# Patient Record
Sex: Female | Born: 1945
Health system: Southern US, Community
[De-identification: ages and names within clinical notes are randomized; demographics above are authoritative.]

## PROBLEM LIST (undated history)

## (undated) DIAGNOSIS — E785 Hyperlipidemia, unspecified: Secondary | ICD-10-CM

## (undated) DIAGNOSIS — I2510 Atherosclerotic heart disease of native coronary artery without angina pectoris: Secondary | ICD-10-CM

## (undated) DIAGNOSIS — K219 Gastro-esophageal reflux disease without esophagitis: Secondary | ICD-10-CM

## (undated) DIAGNOSIS — I341 Nonrheumatic mitral (valve) prolapse: Secondary | ICD-10-CM

## (undated) DIAGNOSIS — G459 Transient cerebral ischemic attack, unspecified: Secondary | ICD-10-CM

## (undated) DIAGNOSIS — I10 Essential (primary) hypertension: Secondary | ICD-10-CM

## (undated) HISTORY — DX: Essential (primary) hypertension: I10

## (undated) HISTORY — DX: Transient cerebral ischemic attack, unspecified: G45.9

## (undated) HISTORY — DX: Atherosclerotic heart disease of native coronary artery without angina pectoris: I25.10

## (undated) HISTORY — DX: Gastro-esophageal reflux disease without esophagitis: K21.9

## (undated) HISTORY — DX: Nonrheumatic mitral (valve) prolapse: I34.1

## (undated) HISTORY — PX: CARDIAC CATHETERIZATION: SHX172

## (undated) HISTORY — DX: Hyperlipidemia, unspecified: E78.5

---

## 1997-10-27 DIAGNOSIS — I2510 Atherosclerotic heart disease of native coronary artery without angina pectoris: Secondary | ICD-10-CM

## 1997-10-27 HISTORY — DX: Atherosclerotic heart disease of native coronary artery without angina pectoris: I25.10

## 2003-10-25 ENCOUNTER — Other Ambulatory Visit: Admission: RE | Admit: 2003-10-25 | Discharge: 2003-10-25 | Payer: Self-pay | Admitting: Obstetrics and Gynecology

## 2005-01-31 ENCOUNTER — Other Ambulatory Visit: Admission: RE | Admit: 2005-01-31 | Discharge: 2005-01-31 | Payer: Self-pay | Admitting: Obstetrics and Gynecology

## 2010-07-30 ENCOUNTER — Ambulatory Visit: Payer: Self-pay | Admitting: Cardiovascular Disease

## 2010-08-13 ENCOUNTER — Ambulatory Visit (HOSPITAL_COMMUNITY): Admission: RE | Admit: 2010-08-13 | Discharge: 2010-08-13 | Payer: Self-pay | Admitting: Cardiovascular Disease

## 2010-08-13 ENCOUNTER — Ambulatory Visit: Payer: Self-pay

## 2010-08-13 ENCOUNTER — Ambulatory Visit: Payer: Self-pay | Admitting: Internal Medicine

## 2010-08-13 ENCOUNTER — Encounter: Payer: Self-pay | Admitting: Cardiology

## 2010-08-13 ENCOUNTER — Ambulatory Visit: Payer: Self-pay | Admitting: Cardiology

## 2010-08-13 DIAGNOSIS — I2510 Atherosclerotic heart disease of native coronary artery without angina pectoris: Secondary | ICD-10-CM | POA: Insufficient documentation

## 2010-08-13 DIAGNOSIS — I059 Rheumatic mitral valve disease, unspecified: Secondary | ICD-10-CM | POA: Insufficient documentation

## 2010-08-16 ENCOUNTER — Ambulatory Visit: Payer: Self-pay | Admitting: Cardiovascular Disease

## 2010-11-19 ENCOUNTER — Ambulatory Visit: Admit: 2010-11-19 | Payer: Self-pay | Admitting: Cardiovascular Disease

## 2011-03-11 NOTE — Assessment & Plan Note (Signed)
[facility] CARDIOLOGY OFFICE NOTE   NAME:Hemmelgarn, LOIS SLAGEL                      MRN:          [id number]  DATE:08/16/2010                            DOB:          May 06, 1946    Kristen Maynard is a 65 year old female who is here today for a followup  visit.  She has the following problem list:  1. Coronary artery disease status post angioplasty without stent      placement in 1999 done by Dr. Juanda Chance.  2. Hypertension.  3. Hyperlipidemia.  4. Previous transient ischemic attack.  5. Gastroesophageal reflux disease.  6. Osteoporosis.   I evaluated the patient for symptoms of mild exertional chest pain.  I  referred her for a treadmill stress test as well as an echocardiogram  due to her symptoms of dyspnea and previous history of mitral valve  prolapse.  Overall, she actually feels better than before.  She  underwent treadmill stress test without any complaints of chest pain or  EKG changes.  Her blood pressure has not been well controlled.   MEDICATIONS:  1. Plavix 75 mg once daily.  2. Simvastatin 40 mg at bedtime.  3. Atenolol 50 mg once daily.  4. Multivitamin once daily.  5. Omeprazole 40 mg once daily.  6. Calcium once daily.  7. Losartan 50 mg once daily will be added.   PHYSICAL EXAMINATION:  VITAL SIGNS:  Weight is 169.6 pounds, blood  pressure is 158/78, pulse is 76, oxygen saturation is 97% on room air.  NECK:  No JVD or carotid bruits.  LUNGS:  Clear to auscultation.  HEART:  Regular rate and rhythm with no gallops or murmurs.  ABDOMEN:  Benign, nontender, nondistended.  EXTREMITIES:  No clubbing, cyanosis or edema.   IMPRESSION:  1. Mild exertional chest pain and dyspnea suggestive of class II      angina which does not seem to be limiting at this time.  She      underwent a treadmill stress test which showed no evidence of      ischemia.  There was no ST-depression or chest pain.  She had  limited exercise capacity though.  Her symptoms actually seems to      be better than before.  At this time I recommend blood pressure      control and aggressive treatment of her risk factors.  We will      continue with Plavix daily.  Her goal LDL should be less than 70      given that she has history of obstructive coronary artery disease.      Her stress test showed evidence of physical deconditioning and thus      I asked her to start an exercise program at least 30 minutes a day      5 times a week.  2. Dyspnea with prior history of mitral valve prolapse.  The      echocardiogram showed normal left ventricular systolic function,      mild left ventricular hypertrophy and mild grade 1 diastolic      dysfunction.  There was  no evidence of mitral valve prolapse.      There is no significant evidence of fluid overload.  3. Hypertension.  Blood pressure is not well controlled.  I will go      ahead and add losartan 50 mg once daily.  We will check basic      metabolic profile in 1 week to ensure stable renal function and      electrolytes.  4. Hyperlipidemia.  Continue with simvastatin.  Goal LDL as mentioned      above is less than 70.  The patient will follow up in 3 months from      now or earlier if needed.     Lorine Bears, MD  Electronically Signed    MA/MedQ  DD: 08/16/2010  DT: 08/17/2010  Job #: [id number]

## 2011-03-11 NOTE — Letter (Signed)
July 30, 2010     RE:  Kristen Maynard, Kristen Maynard  MRN:  [id number]  /  DOB:  1946-03-28   Sharlot Gowda, FNP  Dr. Robb Matar.   Regarding:  Kristen Maynard, medical number [id number].   Dear Kristen Maynard:   Thank you for referring Ms. Fries for further cardiac evaluation.  As  you are aware, this is a pleasant 65 year old female with the following  problem list:  1. Coronary artery disease status post angioplasty without stent      placement in 1999 done by Dr. Juanda Chance.  According to the patient a      stent was not placed due to a small size vessel.  No cardiac events      since then.  2. Mitral valve prolapse.  3. Hypertension.  4. Hyperlipidemia.  5. TIA.  6. Gastroesophageal reflux disease.  7. Osteoporosis.   Kristen Maynard is here today for evaluation of chest pain.  The patient had  angioplasty done in 1999 after she had chest pain and abnormal stress  test.  Her current chest pain is described as aching in the left side of  the chest radiating to the left shoulder.  It happens only with  overexertion and does not happen with regular activities.  It does not  seem to be also reproducible all the time.  She describes this  discomfort as much milder than her previous chest pain in 1999.  She  gets mild dyspnea with it.  There has been no palpitations, syncope or  presyncope.  Overall, she does not do much of regular exercise.  She has  not had any chest pain at rest.  Usually the episode lasts for only a  few minutes and resolves quickly.  The patient has been taking Plavix  since she had a TIA about a year ago.  There has been no recent change  in her medications.   PAST MEDICAL HISTORY:  As outlined above.   MEDICATIONS:  1. Plavix 75 mg once daily.  2. Simvastatin 40 mg at bedtime.  3. Vitamin D 1000 international units 2 a day.  4. Atenolol 50 mg once daily.  5. Multivitamin once daily.  6. Loratadine.  7. Omeprazole 40 mg once daily.  8. Calcium 560 mg once daily.   ALLERGIES:  SULFA AND CODEINE.   SOCIAL HISTORY:  Negative for smoking, alcohol or recreational drug use.  She is a retired [employer].   FAMILY HISTORY:  Negative for premature coronary artery disease.  Her  mother died at the age of 26 of lung cancer.  Her father died at the age  of 66 from COPD.   REVIEW OF SYSTEMS:  Remarkable for seasonal allergies, generalized  fatigue, heartburn, dysuria and recent bladder infection.  Chest pain as  described above as well as dyspnea.  Full review of system was performed  and is otherwise negative.   PHYSICAL EXAMINATION:  GENERAL:  The patient appears to be pleasant and  in no acute distress.  VITAL SIGNS:  Weight is 167.2 pounds, blood pressure is 168/81 in the  right arm, pulse is 63, oxygen saturation is 98% on room air.  HEENT:  Normocephalic, atraumatic.  NECK:  No JVD or carotid bruits.  RESPIRATORY:  Normal respiratory effort with no use of accessory  muscles.  Auscultation reveals normal breath sounds.  CARDIOVASCULAR:  Normal PMI.  Normal S1 and S2 with no gallops or  murmurs.  ABDOMEN:  Benign, nontender, nondistended.  EXTREMITIES:  With  no clubbing, cyanosis or edema.  SKIN:  Warm and dry with no rash.  PSYCHIATRIC:  She is alert, oriented x3 with normal mood and affect.  MUSCULOSKELETAL:  There is normal muscle strength in the upper and lower  extremities.   An electrocardiogram was performed which showed normal sinus rhythm with  no significant ST or T-wave changes.   IMPRESSION:  1. Mild exertional chest pain and dyspnea is suggestive of class II      angina.  She has known history of coronary artery disease and      underwent an angioplasty in 1999 without a stent placement.  At      this time I would like to obtain a treadmill stress test for      further evaluation.  If the stress test is only mildly abnormal, I      might choose to treat her medically with adjustment of her      medications with blood  pressure control.  I explained to her that      if the stress test is significantly abnormal, then we will likely      proceed with coronary angiography.  In the meanwhile will continue      Plavix once daily.  If needed in the future, we can add a small      dose aspirin.  2. Dyspnea with history of mitral valve prolapse:  We will obtain an      echocardiogram for further evaluation.  3. Hypertension:  Her blood pressure is elevated.  Will recheck on      this after the stress test and decide if adding another medication      is needed.  4. Hyperlipidemia:  Her lipid profile is not available for review.  We      will decide how aggressive we need to be in treating this patient      based on her cardiac workup.  The patient will follow-up after      these tests are done.  Thank you for allowing me to participate in      the care of your patient.   Sincerely yours,    Lorine Bears, MD    Sincerely,      Lorine Bears, MD  Electronically Signed    MA/MedQ  DD: 07/30/2010  DT: 07/30/2010  Job #: [id number]

## 2011-03-31 ENCOUNTER — Other Ambulatory Visit: Payer: Self-pay | Admitting: *Deleted

## 2011-03-31 MED ORDER — LOSARTAN POTASSIUM 50 MG PO TABS: 50.0000 mg | ORAL_TABLET | Freq: Every day | ORAL | Status: DC

## 2011-04-22 ENCOUNTER — Encounter: Payer: Self-pay | Admitting: Cardiovascular Disease

## 2011-07-03 ENCOUNTER — Other Ambulatory Visit: Payer: Self-pay | Admitting: *Deleted

## 2011-07-03 MED ORDER — LOSARTAN POTASSIUM 50 MG PO TABS: 50.0000 mg | ORAL_TABLET | Freq: Every day | ORAL | Status: DC

## 2011-07-17 ENCOUNTER — Encounter: Payer: Self-pay | Admitting: Cardiovascular Disease

## 2012-10-05 DIAGNOSIS — H2510 Age-related nuclear cataract, unspecified eye: Secondary | ICD-10-CM

## 2012-10-05 HISTORY — DX: Age-related nuclear cataract, unspecified eye: H25.10

## 2012-10-12 DIAGNOSIS — B0052 Herpesviral keratitis: Secondary | ICD-10-CM | POA: Insufficient documentation

## 2012-10-12 HISTORY — DX: Herpesviral keratitis: B00.52

## 2014-05-03 DIAGNOSIS — Z961 Presence of intraocular lens: Secondary | ICD-10-CM

## 2014-05-03 HISTORY — DX: Presence of intraocular lens: Z96.1

## 2015-07-02 ENCOUNTER — Encounter (HOSPITAL_COMMUNITY): Payer: Self-pay | Admitting: Emergency Medicine

## 2015-07-02 ENCOUNTER — Inpatient Hospital Stay (HOSPITAL_COMMUNITY): Payer: Medicare Other

## 2015-07-02 ENCOUNTER — Observation Stay (HOSPITAL_COMMUNITY)
Admission: EM | Admit: 2015-07-02 | Discharge: 2015-07-04 | Disposition: A | Discharge status: Home / Self Care | Payer: Medicare Other | Attending: Internal Medicine | Admitting: Internal Medicine

## 2015-07-02 DIAGNOSIS — I10 Essential (primary) hypertension: Secondary | ICD-10-CM | POA: Insufficient documentation

## 2015-07-02 DIAGNOSIS — I2510 Atherosclerotic heart disease of native coronary artery without angina pectoris: Secondary | ICD-10-CM | POA: Insufficient documentation

## 2015-07-02 DIAGNOSIS — E785 Hyperlipidemia, unspecified: Secondary | ICD-10-CM | POA: Diagnosis not present

## 2015-07-02 DIAGNOSIS — R0789 Other chest pain: Secondary | ICD-10-CM | POA: Diagnosis not present

## 2015-07-02 DIAGNOSIS — Z79899 Other long term (current) drug therapy: Secondary | ICD-10-CM | POA: Diagnosis not present

## 2015-07-02 DIAGNOSIS — Z7982 Long term (current) use of aspirin: Secondary | ICD-10-CM | POA: Diagnosis not present

## 2015-07-02 DIAGNOSIS — Z8673 Personal history of transient ischemic attack (TIA), and cerebral infarction without residual deficits: Secondary | ICD-10-CM | POA: Diagnosis not present

## 2015-07-02 DIAGNOSIS — K219 Gastro-esophageal reflux disease without esophagitis: Secondary | ICD-10-CM | POA: Diagnosis not present

## 2015-07-02 DIAGNOSIS — R531 Weakness: Secondary | ICD-10-CM | POA: Diagnosis present

## 2015-07-02 DIAGNOSIS — R778 Other specified abnormalities of plasma proteins: Secondary | ICD-10-CM | POA: Diagnosis present

## 2015-07-02 DIAGNOSIS — R079 Chest pain, unspecified: Secondary | ICD-10-CM

## 2015-07-02 DIAGNOSIS — R002 Palpitations: Secondary | ICD-10-CM | POA: Diagnosis not present

## 2015-07-02 DIAGNOSIS — M810 Age-related osteoporosis without current pathological fracture: Secondary | ICD-10-CM | POA: Insufficient documentation

## 2015-07-02 DIAGNOSIS — R7989 Other specified abnormal findings of blood chemistry: Secondary | ICD-10-CM

## 2015-07-02 HISTORY — DX: Other specified abnormal findings of blood chemistry: R79.89

## 2015-07-02 LAB — URINALYSIS, ROUTINE W REFLEX MICROSCOPIC
BILIRUBIN URINE: NEGATIVE
GLUCOSE, UA: NEGATIVE mg/dL
Ketones, ur: NEGATIVE mg/dL
Nitrite: POSITIVE — AB
Protein, ur: NEGATIVE mg/dL
Specific Gravity, Urine: 1.026 (ref 1.005–1.030)
Urobilinogen, UA: 0.2 mg/dL (ref 0.0–1.0)
pH: 5 (ref 5.0–8.0)

## 2015-07-02 LAB — BASIC METABOLIC PANEL
ANION GAP: 9 (ref 5–15)
BUN: 21 mg/dL — AB (ref 6–20)
CALCIUM: 8.9 mg/dL (ref 8.9–10.3)
CO2: 22 mmol/L (ref 22–32)
Chloride: 109 mmol/L (ref 101–111)
Creatinine, Ser: 0.97 mg/dL (ref 0.44–1.00)
GFR calc Af Amer: >60 mL/min (ref >60)
GFR calc non Af Amer: 57 mL/min — ABNORMAL LOW (ref >60)
GLUCOSE: 121 mg/dL — AB (ref 65–99)
Potassium: 3.8 mmol/L (ref 3.5–5.1)
Sodium: 140 mmol/L (ref 135–145)

## 2015-07-02 LAB — CBC
HEMATOCRIT: 38.4 % (ref 36.0–46.0)
HEMOGLOBIN: 13.2 g/dL (ref 12.0–15.0)
MCH: 29.4 pg (ref 26.0–34.0)
MCHC: 34.4 g/dL (ref 30.0–36.0)
MCV: 85.5 fL (ref 78.0–100.0)
Platelets: 174 10*3/uL (ref 150–400)
RBC: 4.49 MIL/uL (ref 3.87–5.11)
RDW: 13 % (ref 11.5–15.5)
WBC: 6.8 10*3/uL (ref 4.0–10.5)

## 2015-07-02 LAB — URINE MICROSCOPIC-ADD ON

## 2015-07-02 LAB — I-STAT TROPONIN, ED
Troponin i, poc: 0.03 ng/mL (ref 0.00–0.08)
Troponin i, poc: 0.12 ng/mL (ref 0.00–0.08)

## 2015-07-02 LAB — CBG MONITORING, ED: Glucose-Capillary: 108 mg/dL — ABNORMAL HIGH (ref 65–99)

## 2015-07-02 MED ORDER — ASPIRIN 81 MG PO CHEW
324.0000 mg | CHEWABLE_TABLET | Freq: Once | ORAL | Status: AC
  Administered 2015-07-02: 324 mg via ORAL
  Filled 2015-07-02: qty 4

## 2015-07-02 MED ORDER — INFLUENZA VAC SPLIT QUAD 0.5 ML IM SUSY
0.5000 mL | PREFILLED_SYRINGE | INTRAMUSCULAR | Status: AC
  Administered 2015-07-03: 0.5 mL via INTRAMUSCULAR
  Filled 2015-07-02: qty 0.5

## 2015-07-02 NOTE — ED Provider Notes (Signed)
Patient seen and evaluated. Discussed with PA Westfall. Patient describes palpitations. Denies pain or shortness of breath. Felt irregular rapid pulse. Felt at within her chest, and also with feeling her pulse. States he would go fast and then slow and then take off again had one blood pressure reading she thought was 1/130, noted that was under 80. Symptoms resolved suddenly while en route hospital.  History of angioplasty without stent in 1999. Had a "normal" go cardiogram within the last 10 years. No history of known structural heart disease. No previous episodes of tachycardia palpitations or diagnosis of atrial fibrillation or arrhythmias.  EKG shows sinus rhythm without injury. First troponin normal. Second troponin bumps 0.12. Plan will be admission,obs, tele, , serial enzymes.  Rolland Porter, MD 07/02/15 2229

## 2015-07-02 NOTE — ED Notes (Signed)
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 Pt states she went inside to take her BP and got "3 abnormal readings, one was 130/100 and another was 80/20". Pt denies any pain at this time, no weakness noted to arms or legs.

## 2015-07-02 NOTE — ED Notes (Signed)
MD at bedside. 

## 2015-07-02 NOTE — ED Provider Notes (Signed)
CSN: [id number]     Arrival date & time 07/02/15  1708 History   First MD Initiated Contact with Patient 07/02/15 1712     Chief Complaint  Patient presents with  . Near Syncope  . Weakness    HPI   Kristen Maynard is a 69 y.o. female with a PMH of CAD s/p angioplasty without stent (1999), HTN, HLD, TIA who presents to the ED with weakness and palpitations. 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 She states she took her BP and had readings of 130/100 and 80/20. She reports this lasted for around 2 hours and slowly started to resolve. She is asymptomatic now. She denies headache, dizziness, syncope, chest pain, shortness of breath, abdominal pain, N/V/D/C, fever, chills, dysuria. Reports urinary urgency. She states this has never happened to her before.   Past Medical History  Diagnosis Date  . CAD (coronary artery disease)     status post angioplasty w/out stent placement in 1999 done by Dr. Juanda Chance. According to pt, a stent was not placed due to small size vessel. No cardiac events since then.  . Mitral valve prolapse   . HTN (hypertension)   . HLD (hyperlipidemia)   . TIA (transient ischemic attack)   . GERD (gastroesophageal reflux disease)   . Osteoporosis    Past Surgical History  Procedure Laterality Date  . Cardiac catheterization     Family History  Problem Relation Age of Onset  . Lung cancer Mother   . COPD Father    Social History  Substance Use Topics  . Smoking status: Never Smoker   . Smokeless tobacco: None  . Alcohol Use: No   OB History    No data available      Review of Systems  Constitutional: Negative for fever, chills, activity change, appetite change and fatigue.  HENT: Negative for congestion.   Eyes: Negative for visual disturbance.  Respiratory: Negative for cough and  shortness of breath.   Cardiovascular: Positive for palpitations. Negative for chest pain and leg swelling.  Gastrointestinal: Negative for nausea, vomiting, abdominal pain, diarrhea, constipation and abdominal distention.  Genitourinary: Positive for urgency. Negative for dysuria and frequency.  Musculoskeletal: Negative for myalgias, back pain, arthralgias, neck pain and neck stiffness.  Skin: Negative for color change, pallor, rash and wound.  Neurological: Positive for weakness. Negative for dizziness, syncope, facial asymmetry, speech difficulty, light-headedness, numbness and headaches.  All other systems reviewed and are negative.     Allergies  Aggrenox; Codeine; and Sulfa antibiotics  Home Medications   Prior to Admission medications   Medication Sig Start Date End Date Taking? Authorizing Provider  acetaminophen (TYLENOL) 325 MG tablet Take 650 mg by mouth every 6 (six) hours as needed for moderate pain.   Yes Historical Provider, MD  aspirin 325 MG tablet Take 162.5 mg by mouth once.   Yes Historical Provider, MD  atenolol (TENORMIN) 50 MG tablet Take 50 mg by mouth daily.     Yes Historical Provider, MD  Calcium-Magnesium-Vitamin D (CALCIUM 1200+D3 PO) Take 1 capsule by mouth daily.   Yes Historical Provider, MD  Cholecalciferol (VITAMIN D) 1000 UNITS capsule Take 1,000 Units by mouth daily.    Yes Historical Provider, MD  clopidogrel (PLAVIX) 75 MG tablet Take 75 mg by mouth daily.     Yes  Historical Provider, MD  CRANBERRY PO Take 1 tablet by mouth daily.   Yes Historical Provider, MD  loratadine (CLARITIN) 10 MG tablet Take 10 mg by mouth daily.   Yes Historical Provider, MD  losartan (COZAAR) 100 MG tablet Take 100 mg by mouth daily.   Yes Historical Provider, MD  Multiple Vitamin (MULTIVITAMIN WITH MINERALS) TABS tablet Take 1 tablet by mouth daily.   Yes Historical Provider, MD  omeprazole (PRILOSEC) 40 MG capsule Take 40 mg by mouth daily.   Yes Historical Provider, MD   simvastatin (ZOCOR) 40 MG tablet Take 40 mg by mouth daily at 6 PM.   Yes Historical Provider, MD  acyclovir (ZOVIRAX) 400 MG tablet Take 400 mg by mouth 2 (two) times daily between meals as needed (for optic infection).    Historical Provider, MD    BP 121/56 mmHg  Pulse 87  Temp(Src) 97.7 F (36.5 C) (Oral)  Resp 22  SpO2 97% Physical Exam  Constitutional: She is oriented to person, place, and time. She appears well-developed and well-nourished. No distress.  HENT:  Head: Normocephalic and atraumatic.  Right Ear: External ear normal.  Left Ear: External ear normal.  Nose: Nose normal.  Mouth/Throat: Uvula is midline, oropharynx is clear and moist and mucous membranes are normal. No oropharyngeal exudate.  Eyes: Conjunctivae, EOM and lids are normal. Pupils are equal, round, and reactive to light. Right eye exhibits no discharge. Left eye exhibits no discharge. No scleral icterus.  Neck: Normal range of motion. Neck supple.  Cardiovascular: Normal rate, regular rhythm, normal heart sounds, intact distal pulses and normal pulses.   Pulmonary/Chest: Effort normal and breath sounds normal. No respiratory distress. She has no wheezes. She has no rales. She exhibits no tenderness.  Abdominal: Soft. Normal appearance and bowel sounds are normal. She exhibits no distension and no mass. There is no tenderness. There is no rigidity, no rebound and no guarding.  Musculoskeletal: Normal range of motion. She exhibits no edema or tenderness.  Neurological: She is alert and oriented to person, place, and time. She has normal strength. No cranial nerve deficit or sensory deficit. Coordination normal.  Skin: Skin is warm, dry and intact. No rash noted. She is not diaphoretic. No erythema. No pallor.  Psychiatric: She has a normal mood and affect. Her speech is normal and behavior is normal. Judgment and thought content normal.  Nursing note and vitals reviewed.   ED Course  Procedures (including  critical care time)  Labs Review Labs Reviewed  BASIC METABOLIC PANEL - Abnormal; Notable for the following:    Glucose, Bld 121 (*)    BUN 21 (*)    GFR calc non Af Amer 57 (*)    All other components within normal limits  URINALYSIS, ROUTINE W REFLEX MICROSCOPIC (NOT AT [facility]) - Abnormal; Notable for the following:    APPearance HAZY (*)    Hgb urine dipstick TRACE (*)    Nitrite POSITIVE (*)    Leukocytes, UA SMALL (*)    All other components within normal limits  URINE MICROSCOPIC-ADD ON - Abnormal; Notable for the following:    Squamous Epithelial / LPF FEW (*)    Bacteria, UA FEW (*)    All other components within normal limits  CBG MONITORING, ED - Abnormal; Notable for the following:    Glucose-Capillary 108 (*)    All other components within normal limits  CBC  I-STAT TROPOININ, ED    Imaging Review Dg Chest 2 View  07/02/2015  CLINICAL DATA:  Weakness and dizziness for 1 day.  EXAM: CHEST  2 VIEW  COMPARISON:  01/05/2015  FINDINGS: The heart size and mediastinal contours are within normal limits. Both lungs are clear. The visualized skeletal structures are unremarkable.  IMPRESSION: No active cardiopulmonary disease.   Electronically Signed   By: Burman Nieves M.D.   On: 07/02/2015 23:29     I have personally reviewed and evaluated these images and lab results as part of my medical decision-making.   EKG Interpretation   Date/Time:  Monday July 02 2015 17:16:40 EDT Ventricular Rate:  101 PR Interval:  181 QRS Duration: 83 QT Interval:  364 QTC Calculation: 472 R Axis:   71 Text Interpretation:  Sinus tachycardia Abnormal R-wave progression, early  transition Confirmed by Fayrene Fearing  MD, MARK ([id number]) on 07/02/2015 9:06:08 PM      MDM   Final diagnoses:  Weakness  Palpitations  Elevated troponin   69 year old female presents with weakness and palpitations, which started this afternoon and lasted approximately 2 hours, resolved prior to arrival. She  denies headache, dizziness, syncope, chest pain, shortness of breath, abdominal pain, N/V/D/C, fever, chills, dysuria, urgency, frequency.   Patient is afebrile. Vitals stable. Normal neuro exam. No focal neurological deficit. RRR, lungs clear to auscultation bilaterally, abdomen soft, non-distended, non-tender. No lower extremity edema.  EKG non-ischemic. Troponin negative x 1. CBC, BMP unremarkable. UA positive for nitrites and leukocytes. CXR no active cardiopulmonary disease.  Will obtain 3 hour troponin.  Delta troponin elevated at 0.12. Patient to be admitted for further evaluation and management, including serial enzymes. Spoke with hospitalist, who will admit.   BP 121/56 mmHg  Pulse 87  Temp(Src) 97.7 F (36.5 C) (Oral)  Resp 22  SpO2 97%       Mady Gemma, PA-C 07/03/15 0038  Rolland Porter, MD 07/10/15 [phone]

## 2015-07-03 ENCOUNTER — Observation Stay (HOSPITAL_BASED_OUTPATIENT_CLINIC_OR_DEPARTMENT_OTHER): Payer: Medicare Other

## 2015-07-03 ENCOUNTER — Observation Stay (HOSPITAL_COMMUNITY): Payer: Medicare Other

## 2015-07-03 ENCOUNTER — Encounter (HOSPITAL_COMMUNITY): Payer: Self-pay | Admitting: Internal Medicine

## 2015-07-03 DIAGNOSIS — E785 Hyperlipidemia, unspecified: Secondary | ICD-10-CM | POA: Diagnosis present

## 2015-07-03 DIAGNOSIS — I10 Essential (primary) hypertension: Secondary | ICD-10-CM | POA: Diagnosis present

## 2015-07-03 DIAGNOSIS — I2510 Atherosclerotic heart disease of native coronary artery without angina pectoris: Secondary | ICD-10-CM

## 2015-07-03 DIAGNOSIS — R000 Tachycardia, unspecified: Secondary | ICD-10-CM

## 2015-07-03 DIAGNOSIS — Z8673 Personal history of transient ischemic attack (TIA), and cerebral infarction without residual deficits: Secondary | ICD-10-CM

## 2015-07-03 DIAGNOSIS — R7989 Other specified abnormal findings of blood chemistry: Secondary | ICD-10-CM | POA: Diagnosis not present

## 2015-07-03 DIAGNOSIS — R002 Palpitations: Secondary | ICD-10-CM | POA: Diagnosis present

## 2015-07-03 HISTORY — DX: Personal history of transient ischemic attack (TIA), and cerebral infarction without residual deficits: Z86.73

## 2015-07-03 HISTORY — DX: Palpitations: R00.2

## 2015-07-03 HISTORY — DX: Essential (primary) hypertension: I10

## 2015-07-03 HISTORY — DX: Hyperlipidemia, unspecified: E78.5

## 2015-07-03 LAB — COMPREHENSIVE METABOLIC PANEL
ALT: 21 U/L (ref 14–54)
AST: 24 U/L (ref 15–41)
Albumin: 3.4 g/dL — ABNORMAL LOW (ref 3.5–5.0)
Alkaline Phosphatase: 85 U/L (ref 38–126)
Anion gap: 9 (ref 5–15)
BUN: 18 mg/dL (ref 6–20)
CHLORIDE: 107 mmol/L (ref 101–111)
CO2: 23 mmol/L (ref 22–32)
Calcium: 8.4 mg/dL — ABNORMAL LOW (ref 8.9–10.3)
Creatinine, Ser: 0.71 mg/dL (ref 0.44–1.00)
Glucose, Bld: 101 mg/dL — ABNORMAL HIGH (ref 65–99)
POTASSIUM: 3.8 mmol/L (ref 3.5–5.1)
SODIUM: 139 mmol/L (ref 135–145)
Total Bilirubin: 0.6 mg/dL (ref 0.3–1.2)
Total Protein: 5.8 g/dL — ABNORMAL LOW (ref 6.5–8.1)

## 2015-07-03 LAB — CBC WITH DIFFERENTIAL/PLATELET
Basophils Absolute: 0 10*3/uL (ref 0.0–0.1)
Basophils Relative: 0 % (ref 0–1)
Eosinophils Absolute: 0.1 10*3/uL (ref 0.0–0.7)
Eosinophils Relative: 1 % (ref 0–5)
HCT: 36.6 % (ref 36.0–46.0)
HEMOGLOBIN: 12.5 g/dL (ref 12.0–15.0)
LYMPHS ABS: 0.7 10*3/uL (ref 0.7–4.0)
LYMPHS PCT: 17 % (ref 12–46)
MCH: 29.3 pg (ref 26.0–34.0)
MCHC: 34.2 g/dL (ref 30.0–36.0)
MCV: 85.9 fL (ref 78.0–100.0)
Monocytes Absolute: 0.3 10*3/uL (ref 0.1–1.0)
Monocytes Relative: 7 % (ref 3–12)
NEUTROS PCT: 75 % (ref 43–77)
Neutro Abs: 3.4 10*3/uL (ref 1.7–7.7)
Platelets: 174 10*3/uL (ref 150–400)
RBC: 4.26 MIL/uL (ref 3.87–5.11)
RDW: 13.4 % (ref 11.5–15.5)
WBC: 4.5 10*3/uL (ref 4.0–10.5)

## 2015-07-03 LAB — TROPONIN I
TROPONIN I: 0.09 ng/mL — AB (ref <0.031)
Troponin I: 0.14 ng/mL — ABNORMAL HIGH (ref <0.031)
Troponin I: 0.06 ng/mL — ABNORMAL HIGH (ref <0.031)

## 2015-07-03 LAB — T4, FREE: FREE T4: 0.91 ng/dL (ref 0.61–1.12)

## 2015-07-03 LAB — D-DIMER, QUANTITATIVE (NOT AT ARMC): D DIMER QUANT: 0.6 ug{FEU}/mL — AB (ref 0.00–0.48)

## 2015-07-03 LAB — TSH: TSH: 1.158 u[IU]/mL (ref 0.350–4.500)

## 2015-07-03 MED ORDER — ONDANSETRON HCL 4 MG PO TABS
4.0000 mg | ORAL_TABLET | Freq: Four times a day (QID) | ORAL | Status: DC | PRN
Start: 2015-07-03 — End: 2015-07-04

## 2015-07-03 MED ORDER — IOHEXOL 350 MG/ML SOLN
100.0000 mL | Freq: Once | INTRAVENOUS | Status: AC | PRN
  Administered 2015-07-03: 100 mL via INTRAVENOUS

## 2015-07-03 MED ORDER — CLOPIDOGREL BISULFATE 75 MG PO TABS
75.0000 mg | ORAL_TABLET | Freq: Every day | ORAL | Status: DC
  Administered 2015-07-03: 75 mg via ORAL
  Filled 2015-07-03: qty 1

## 2015-07-03 MED ORDER — SIMVASTATIN 40 MG PO TABS
40.0000 mg | ORAL_TABLET | Freq: Every day | ORAL | Status: DC
  Administered 2015-07-03 (×2): 40 mg via ORAL
  Filled 2015-07-03 (×2): qty 1

## 2015-07-03 MED ORDER — ASPIRIN 325 MG PO TABS
162.5000 mg | ORAL_TABLET | Freq: Every day | ORAL | Status: DC
  Administered 2015-07-03: 162.5 mg via ORAL
  Filled 2015-07-03: qty 1

## 2015-07-03 MED ORDER — ASPIRIN 81 MG PO CHEW
81.0000 mg | CHEWABLE_TABLET | Freq: Every day | ORAL | Status: DC
  Administered 2015-07-04: 81 mg via ORAL
  Filled 2015-07-03: qty 1

## 2015-07-03 MED ORDER — ENOXAPARIN SODIUM 40 MG/0.4ML ~~LOC~~ SOLN
40.0000 mg | SUBCUTANEOUS | Status: DC
  Administered 2015-07-03 – 2015-07-04 (×2): 40 mg via SUBCUTANEOUS
  Filled 2015-07-03 (×2): qty 0.4

## 2015-07-03 MED ORDER — SODIUM CHLORIDE 0.9 % IJ SOLN
3.0000 mL | Freq: Two times a day (BID) | INTRAMUSCULAR | Status: DC
  Administered 2015-07-03 – 2015-07-04 (×4): 3 mL via INTRAVENOUS

## 2015-07-03 MED ORDER — ATENOLOL 25 MG PO TABS
50.0000 mg | ORAL_TABLET | Freq: Every day | ORAL | Status: DC
  Administered 2015-07-03 (×2): 50 mg via ORAL
  Filled 2015-07-03 (×2): qty 2

## 2015-07-03 MED ORDER — PANTOPRAZOLE SODIUM 40 MG PO TBEC
40.0000 mg | DELAYED_RELEASE_TABLET | Freq: Every day | ORAL | Status: DC
  Administered 2015-07-03 – 2015-07-04 (×2): 40 mg via ORAL
  Filled 2015-07-03 (×2): qty 1

## 2015-07-03 MED ORDER — LORATADINE 10 MG PO TABS
10.0000 mg | ORAL_TABLET | Freq: Every day | ORAL | Status: DC
  Administered 2015-07-03 – 2015-07-04 (×2): 10 mg via ORAL
  Filled 2015-07-03 (×2): qty 1

## 2015-07-03 MED ORDER — ONDANSETRON HCL 4 MG/2ML IJ SOLN: 4.0000 mg | Freq: Four times a day (QID) | INTRAMUSCULAR | Status: DC | PRN

## 2015-07-03 MED ORDER — ACETAMINOPHEN 650 MG RE SUPP: 650.0000 mg | Freq: Four times a day (QID) | RECTAL | Status: DC | PRN

## 2015-07-03 MED ORDER — ADULT MULTIVITAMIN W/MINERALS CH
1.0000 | ORAL_TABLET | Freq: Every day | ORAL | Status: DC
  Administered 2015-07-03 – 2015-07-04 (×2): 1 via ORAL
  Filled 2015-07-03 (×2): qty 1

## 2015-07-03 MED ORDER — LOSARTAN POTASSIUM 50 MG PO TABS
100.0000 mg | ORAL_TABLET | Freq: Every day | ORAL | Status: DC
  Administered 2015-07-03 – 2015-07-04 (×2): 100 mg via ORAL
  Filled 2015-07-03 (×2): qty 2

## 2015-07-03 MED ORDER — ACETAMINOPHEN 325 MG PO TABS: 650.0000 mg | ORAL_TABLET | Freq: Four times a day (QID) | ORAL | Status: DC | PRN

## 2015-07-03 NOTE — H&P (Signed)
[facility] History and Physical  Kristen Maynard RUE:[id number] DOB: July 11, 1946 DOA: 07/02/2015  Referring physician: Dr. Fayrene Fearing. PCP: Konrad Felix, MD  Specialists: None. He is to follow with Dr. Juanda Chance previously.  Chief Complaint: Palpitations.  HPI: Kristen Maynard is a 69 y.o. female with history of CAD status post stenting, hypertension and hyperlipidemia presents to the ER because of palpitations. Patient states last evening patient had palpitations which was continuous lasting almost for 2 hours during which patient felt mildly dizzy and diaphoretic. Denies any chest pain or shortness of breath. In the ER EKG was showing sinus tachycardia rate around 110 bpm. Presently patient is asymptomatic. Denies any previous episodes of similar. Patient has been admitted for further observation. Denies missing her medications. Denies any nausea vomiting abdominal pain or diarrhea. Denies any fever chills or productive cough.  Review of Systems: As presented in the history of presenting illness, rest negative.  Past Medical History  Diagnosis Date  . CAD (coronary artery disease)     status post angioplasty w/out stent placement in 1999 done by Dr. Juanda Chance. According to pt, a stent was not placed due to small size vessel. No cardiac events since then.  . Mitral valve prolapse   . HTN (hypertension)   . HLD (hyperlipidemia)   . TIA (transient ischemic attack)   . GERD (gastroesophageal reflux disease)   . Osteoporosis    Past Surgical History  Procedure Laterality Date  . Cardiac catheterization     Social History:  reports that she has never smoked. She does not have any smokeless tobacco history on file. She reports that she does not drink alcohol or use illicit drugs. Where does patient live home. Can patient participate in ADLs? Yes.  Allergies  Allergen Reactions  . Aggrenox [Aspirin-Dipyridamole Er] Other (See Comments)    Headaches maybe? Told never to take med  . Codeine  Other (See Comments)    Confusion  . Sulfa Antibiotics Other (See Comments)    Seizures     Family History:  Family History  Problem Relation Age of Onset  . Lung cancer Mother   . COPD Father       Prior to Admission medications   Medication Sig Start Date End Date Taking? Authorizing Provider  acetaminophen (TYLENOL) 325 MG tablet Take 650 mg by mouth every 6 (six) hours as needed for moderate pain.   Yes Historical Provider, MD  aspirin 325 MG tablet Take 162.5 mg by mouth once.   Yes Historical Provider, MD  atenolol (TENORMIN) 50 MG tablet Take 50 mg by mouth daily.     Yes Historical Provider, MD  Calcium-Magnesium-Vitamin D (CALCIUM 1200+D3 PO) Take 1 capsule by mouth daily.   Yes Historical Provider, MD  Cholecalciferol (VITAMIN D) 1000 UNITS capsule Take 1,000 Units by mouth daily.    Yes Historical Provider, MD  clopidogrel (PLAVIX) 75 MG tablet Take 75 mg by mouth daily.     Yes Historical Provider, MD  CRANBERRY PO Take 1 tablet by mouth daily.   Yes Historical Provider, MD  loratadine (CLARITIN) 10 MG tablet Take 10 mg by mouth daily.   Yes Historical Provider, MD  losartan (COZAAR) 100 MG tablet Take 100 mg by mouth daily.   Yes Historical Provider, MD  Multiple Vitamin (MULTIVITAMIN WITH MINERALS) TABS tablet Take 1 tablet by mouth daily.   Yes Historical Provider, MD  omeprazole (PRILOSEC) 40 MG capsule Take 40 mg by mouth daily.   Yes Historical Provider, MD  simvastatin (  ZOCOR) 40 MG tablet Take 40 mg by mouth daily at 6 PM.   Yes Historical Provider, MD  acyclovir (ZOVIRAX) 400 MG tablet Take 400 mg by mouth 2 (two) times daily between meals as needed (for optic infection).    Historical Provider, MD    Physical Exam: Filed Vitals:   07/02/15 2100 07/02/15 2130 07/02/15 2200 07/02/15 2255  BP: 146/70 139/61 162/62 162/64  Pulse: 92 87 93 91  Temp:    97.5 F (36.4 C)  TempSrc:    Oral  Resp: Height:     (1.676 m)  Weight:    68.992 kg (152 lb  1.6 oz)  SpO2: 97% 98% 96% 100%     General:  Moderately built and nourished.  Eyes: Anicteric no pallor.  ENT: No discharge from the ears eyes nose or mouth.  Neck: No mass felt.  Cardiovascular: S1 and S2 heard.  Respiratory: No rhonchi or crepitations.  Abdomen: Soft nontender bowel sounds present.  Skin: No rash.  Musculoskeletal: No edema.  Psychiatric: Appears normal.  Neurologic: Alert awake oriented to time place and person. Moves all extremities.  Labs on Admission:  Basic Metabolic Panel:  Recent Labs Lab 07/02/15 1730  NA 140  K 3.8  CL 109  CO2 22  GLUCOSE 121*  BUN 21*  CREATININE 0.97  CALCIUM 8.9   Liver Function Tests: No results for input(s): AST, ALT, ALKPHOS, BILITOT, PROT, ALBUMIN in the last 168 hours. No results for input(s): LIPASE, AMYLASE in the last 168 hours. No results for input(s): AMMONIA in the last 168 hours. CBC:  Recent Labs Lab 07/02/15 1730  WBC 6.8  HGB 13.2  HCT 38.4  MCV 85.5  PLT 174   Cardiac Enzymes: No results for input(s): CKTOTAL, CKMB, CKMBINDEX, TROPONINI in the last 168 hours.  BNP (last 3 results) No results for input(s): BNP in the last 8760 hours.  ProBNP (last 3 results) No results for input(s): PROBNP in the last 8760 hours.  CBG:  Recent Labs Lab 07/02/15 1729  GLUCAP 108*    Radiological Exams on Admission: Dg Chest 2 View  07/02/2015   CLINICAL DATA:  Weakness and dizziness for 1 day.  EXAM: CHEST  2 VIEW  COMPARISON:  01/05/2015  FINDINGS: The heart size and mediastinal contours are within normal limits. Both lungs are clear. The visualized skeletal structures are unremarkable.  IMPRESSION: No active cardiopulmonary disease.   Electronically Signed   By: Burman Nieves M.D.   On: 07/02/2015 23:29    EKG: Independently reviewed. Sinus tachycardia.  Assessment/Plan Principal Problem:   Palpitations Active Problems:   Elevated troponin   CAD in native artery s/p stent    Essential hypertension   Hyperlipidemia   History of stroke   1. Palpitations - at this time patient's monitor showing sinus rhythm. Will closely observe in telemetry. Check thyroid function tests. Check d-dimer and posteriorly with CT angiogram. I have consulted cardiology. 2. CAD status post stenting with mildly elevated troponin - troponin may be mildly limited due to patient's tachycardia. Patient's on anti-plated agent beta blockers and statins which will be continued. 3. Hyperlipidemia on statins.  I have reviewed patient's old charts were labs. I have consulted cardiology.   DVT Prophylaxis Lovenox.  Code Status: Full code.  Family Communication: Discussed with patient's daughter.  Disposition Plan: Admit for observation.    Taahir Grisby N. [facility] Pager [phone].  If 7PM-7AM, please contact night-coverage [email or web address] Password [email or web address]  07/03/2015, 12:28 AM

## 2015-07-03 NOTE — Progress Notes (Signed)
During bedside report, patient notified RNs that she has had more than 5 loose BMs since admission. Handoff to [place], Charity fundraiser, who has already spoken with Dr. Benjamine Mola. Plan pending.

## 2015-07-03 NOTE — Progress Notes (Signed)
  Echocardiogram 2D Echocardiogram has been performed.  Delcie Roch 07/03/2015, 3:26 PM

## 2015-07-03 NOTE — Progress Notes (Signed)
Patient admitted after midnight, please see H&P.   1. Palpitations - tele shows NSR. Will closely observe in telemetry.  thyroid function tests ok. CTA negative.  Cardiology consult. 2. CAD status post stenting with mildly elevated troponin - troponin may be mildly limited due to patient's tachycardia. Patient's on anti-plated agent beta blockers and statins which will be continued. 3. Hyperlipidemia on statins. 4. Diarrhea- present on admission- will check pathogen panel  Marlin Canary

## 2015-07-03 NOTE — Progress Notes (Addendum)
Patient admitted with chest pain and palpitations. Lab results so far have demonstrated elevated troponin I at point-of-care and positive d-dimer fibrin-derivatives. Patient has been asymptomatic (no complaint of chest pain, palpitations or SOB) since arrival to floor and vital signs have been WDL.  CT-angiogram of chest was ordered approximately one hour ago to evaluate for pulmonary embolus and was negative for any acute findings. However, troponin I continues to rise (current lab value = 0.14, up from 0.12 at point-of-care).  Patient remains complaint/symptom-free.  Have communicated new lab values, pertinent diagnostic data and patient status to Tama Gander, NP on call for [facility].  Continuing to monitor closely.     Addendum for 07/03/15 05:35:  Spoke with admitting physician, Dr. Toniann Fail re: same above. No new orders at this time. Will continue to monitor.

## 2015-07-03 NOTE — Consult Note (Addendum)
Admit date: 07/02/2015 Referring Physician  Dr. Benjamine Mola Primary Physician Konrad Felix, MD Primary Cardiologist  Dr. Kirke Corin Reason for Consultation  elevated troponin  HPI: 69 year old female with known coronary artery disease status post angioplasty without stent placement in 1999, Dr. Dickie La, essential hypertension, hyperlipidemia, GERD who last saw Dr. Kirke Corin in 2011 here with elevated troponin in the setting of diarrhea, no chest pain.  Previously had a treadmill stress test which showed no evidence of ischemia, no ST segment depression at that time. Exercise was limited. Symptoms were well controlled with aunt anginal medications. Plavix was continued.  Most recent echo cardiac and showed no evidence of mitral valve prolapse as she was previously diagnosed several years ago.  Here, troponin was mildly elevated at 0.14, now 0.09. This was felt to be possibly secondary to palpitations was lasted approximately 2 hours, continuous leaving the patient feeling weak, lightheaded. No chest pain, no shortness of breath accompanied. EKG in emergency department was sinus tachycardia with heart rate of 110 bpm.   After she was admitted, she began having diarrhea. No bleeding, no melana. No anemia.  A CT of the chest was performed, no pulmonary embolism. She did have scattered coronary calcifications noted.  Original EKG on 07/02/15 show sinus tachycardia rate 105 with no other ST segment changes.   PMH:   Past Medical History  Diagnosis Date  . CAD (coronary artery disease)     status post angioplasty w/out stent placement in 1999 done by Dr. Juanda Chance. According to pt, a stent was not placed due to small size vessel. No cardiac events since then.  . Mitral valve prolapse   . HTN (hypertension)   . HLD (hyperlipidemia)   . TIA (transient ischemic attack)   . GERD (gastroesophageal reflux disease)   . Osteoporosis     PSH:   Past Surgical History  Procedure Laterality Date  . Cardiac  catheterization     Allergies:  Aggrenox; Codeine; and Sulfa antibiotics Prior to Admit Meds:   Prior to Admission medications   Medication Sig Start Date End Date Taking? Authorizing Provider  acetaminophen (TYLENOL) 325 MG tablet Take 650 mg by mouth every 6 (six) hours as needed for moderate pain.   Yes Historical Provider, MD  aspirin 325 MG tablet Take 162.5 mg by mouth once.   Yes Historical Provider, MD  atenolol (TENORMIN) 50 MG tablet Take 50 mg by mouth daily.     Yes Historical Provider, MD  Calcium-Magnesium-Vitamin D (CALCIUM 1200+D3 PO) Take 1 capsule by mouth daily.   Yes Historical Provider, MD  Cholecalciferol (VITAMIN D) 1000 UNITS capsule Take 1,000 Units by mouth daily.    Yes Historical Provider, MD  clopidogrel (PLAVIX) 75 MG tablet Take 75 mg by mouth daily.     Yes Historical Provider, MD  CRANBERRY PO Take 1 tablet by mouth daily.   Yes Historical Provider, MD  loratadine (CLARITIN) 10 MG tablet Take 10 mg by mouth daily.   Yes Historical Provider, MD  losartan (COZAAR) 100 MG tablet Take 100 mg by mouth daily.   Yes Historical Provider, MD  Multiple Vitamin (MULTIVITAMIN WITH MINERALS) TABS tablet Take 1 tablet by mouth daily.   Yes Historical Provider, MD  omeprazole (PRILOSEC) 40 MG capsule Take 40 mg by mouth daily.   Yes Historical Provider, MD  simvastatin (ZOCOR) 40 MG tablet Take 40 mg by mouth daily at 6 PM.   Yes Historical Provider, MD  acyclovir (ZOVIRAX) 400 MG tablet Take 400 mg by  mouth 2 (two) times daily between meals as needed (for optic infection).    Historical Provider, MD   Fam HX:    Family History  Problem Relation Age of Onset  . Lung cancer Mother   . COPD Father    Social HX:    Social History   Social History  . Marital Status: Married    Spouse Name: N/A  . Number of Children: N/A  . Years of Education: N/A   Occupational History  . Not on file.   Social History Main Topics  . Smoking status: Never Smoker   . Smokeless  tobacco: Not on file  . Alcohol Use: No  . Drug Use: No  . Sexual Activity: Not on file   Other Topics Concern  . Not on file   Social History Narrative   Retired.      ROS:  All 11 ROS were addressed and are negative except what is stated in the HPI   Physical Exam: Blood pressure 139/52, pulse 83, temperature 98.6 F (37 C), temperature source Oral, resp. rate 20, height 5\' 6"  (1.676 m), weight 150 lb 14.4 oz (68.448 kg), SpO2 96 %.   General: Well developed, well nourished, in no acute distress Head: Eyes PERRLA, No xanthomas.   Normal cephalic and atramatic  Lungs:   Clear bilaterally to auscultation and percussion. Normal respiratory effort. No wheezes, no rales. Heart:   HRRR S1 S2 Pulses are 2+ & equal. No murmur, rubs, gallops.  No carotid bruit. No JVD.  No abdominal bruits.  Abdomen: Bowel sounds are positive, abdomen soft and non-tender without masses. No hepatosplenomegaly. Msk:  Back normal. Normal strength and tone for age. Extremities:  No clubbing, cyanosis or edema.  DP +1, skin with pale appearance Neuro: Alert and oriented X 3, non-focal, MAE x 4 GU: Deferred Rectal: Deferred Psych:  Good affect, responds appropriately      Labs: Lab Results  Component Value Date   WBC 4.5 07/03/2015   HGB 12.5 07/03/2015   HCT 36.6 07/03/2015   MCV 85.9 07/03/2015   PLT 174 07/03/2015     Recent Labs Lab 07/03/15 0648  NA 139  K 3.8  CL 107  CO2 23  BUN 18  CREATININE 0.71  CALCIUM 8.4*  PROT 5.8*  BILITOT 0.6  ALKPHOS 85  ALT 21  AST 24  GLUCOSE 101*    Recent Labs  07/03/15 0059 07/03/15 0648  TROPONINI 0.14* 0.09*   No results found for: CHOL, HDL, LDLCALC, TRIG Lab Results  Component Value Date   DDIMER 0.60* 07/03/2015     Radiology:  Dg Chest 2 View  07/02/2015   CLINICAL DATA:  Weakness and dizziness for 1 day.  EXAM: CHEST  2 VIEW  COMPARISON:  01/05/2015  FINDINGS: The heart size and mediastinal contours are within normal limits.  Both lungs are clear. The visualized skeletal structures are unremarkable.  IMPRESSION: No active cardiopulmonary disease.   Electronically Signed   By: Burman Nieves M.D.   On: 07/02/2015 23:29   Ct Angio Chest Pe W/cm &/or Wo Cm  07/03/2015   CLINICAL DATA:  Chest pain, relieve since arrival. Elevated D-dimer.  EXAM: CT ANGIOGRAPHY CHEST WITH CONTRAST  TECHNIQUE: Multidetector CT imaging of the chest was performed using the standard protocol during bolus administration of intravenous contrast. Multiplanar CT image reconstructions and MIPs were obtained to evaluate the vascular anatomy.  CONTRAST:  OMNIPAQUE IOHEXOL 350 MG/ML SOLN  COMPARISON:  None.  FINDINGS: Technically adequate study with good opacification of the central and segmental pulmonary arteries. No focal filling defects demonstrated. No evidence of significant pulmonary embolus.  Normal heart size. Normal caliber thoracic aorta. Scattered aortic calcification. Great vessel origins are patent. Coronary artery calcifications. Esophagus is decompressed. Lymph nodes in the mediastinum are not pathologically enlarged.  Evaluation of lungs is limited due to respiratory motion artifact. There is dependent atelectasis in the lungs. No focal consolidation. No pleural effusions. No pneumothorax. Airways appear patent.  Included portions of the upper abdominal organs are grossly unremarkable. Degenerative changes in the spine. No destructive bone lesions.  Review of the MIP images confirms the above findings.  IMPRESSION: No evidence of significant pulmonary embolus. Dependent atelectasis in the lung bases.   Electronically Signed   By: Burman Nieves M.D.   On: 07/03/2015 03:29   Personally viewed.  EKG:  As above in history of present illness. Personally viewed.   Echo 2011: Reviewed as above, normal ejection fraction   ASSESSMENT/PLAN:    69 year old female with coronary artery disease post angioplasty in 1999, prior treadmill test in  approximately 2011 with no ischemia, hyperlipidemia, hypertension who had approximately 2 hours of increased heart rate, palpitations, tachycardia now with mildly elevated troponin, peak 0.14 now 0.09 with no chest pain. Diarrhea was noted, present on admission.  1. Elevated troponin  - Minimally elevated troponin, trended downward, likely associated with underlying medical illness, diarrhea, tachycardia palpitations. Current telemetry review shows no adverse arrhythmias. Heart rate is currently in the 70s. She is feeling better but still having diarrhea.     -Echocardiogram pending to ensure proper structure and function.   - As an outpatient, it would not be unreasonable to check a stress test given her minimally elevated troponin, likely result of demand ischemia. Since she is not having any chest discomfort nor EKG changes, I do not think that this is a traditional non-ST elevation myocardial infarction. If symptoms change, diagnosis may change.  2. Coronary artery disease-prior balloon angioplasty in 1999, Dr. Dickie La. We will decrease aspirin to 81 mg and continue clopidogrel 75 mg.  3. Diarrhea - per primary team. Possible underlying gastroenterology driving current illness.  4. Sinus tachycardia-currently resolved. Continue with beta blocker, atenolol 50 g once a day. Checking echocardiogram. PE CT normal.   5. Essential hypertension - currently well controlled.  6. Hyperlipidemia-continue with statin.  7. History of stroke-no residual symptoms. Continuing Plavix, low-dose aspirin.   Donato Schultz, MD  07/03/2015  10:45 AM

## 2015-07-04 ENCOUNTER — Encounter (HOSPITAL_COMMUNITY): Payer: Self-pay | Admitting: Student

## 2015-07-04 ENCOUNTER — Other Ambulatory Visit: Payer: Self-pay | Admitting: Student

## 2015-07-04 DIAGNOSIS — I10 Essential (primary) hypertension: Secondary | ICD-10-CM | POA: Diagnosis not present

## 2015-07-04 DIAGNOSIS — E785 Hyperlipidemia, unspecified: Secondary | ICD-10-CM

## 2015-07-04 DIAGNOSIS — R7989 Other specified abnormal findings of blood chemistry: Secondary | ICD-10-CM

## 2015-07-04 DIAGNOSIS — R002 Palpitations: Secondary | ICD-10-CM | POA: Diagnosis not present

## 2015-07-04 DIAGNOSIS — R778 Other specified abnormalities of plasma proteins: Secondary | ICD-10-CM

## 2015-07-04 DIAGNOSIS — Z8673 Personal history of transient ischemic attack (TIA), and cerebral infarction without residual deficits: Secondary | ICD-10-CM | POA: Diagnosis not present

## 2015-07-04 LAB — MAGNESIUM: MAGNESIUM: 2.1 mg/dL (ref 1.7–2.4)

## 2015-07-04 LAB — CBC
HEMATOCRIT: 35.9 % — AB (ref 36.0–46.0)
HEMOGLOBIN: 12.3 g/dL (ref 12.0–15.0)
MCH: 29.5 pg (ref 26.0–34.0)
MCHC: 34.3 g/dL (ref 30.0–36.0)
MCV: 86.1 fL (ref 78.0–100.0)
Platelets: 149 10*3/uL — ABNORMAL LOW (ref 150–400)
RBC: 4.17 MIL/uL (ref 3.87–5.11)
RDW: 13.2 % (ref 11.5–15.5)
WBC: 4 10*3/uL (ref 4.0–10.5)

## 2015-07-04 LAB — BASIC METABOLIC PANEL
Anion gap: 5 (ref 5–15)
BUN: 13 mg/dL (ref 6–20)
CHLORIDE: 109 mmol/L (ref 101–111)
CO2: 25 mmol/L (ref 22–32)
CREATININE: 0.78 mg/dL (ref 0.44–1.00)
Calcium: 8.4 mg/dL — ABNORMAL LOW (ref 8.9–10.3)
GFR calc non Af Amer: >60 mL/min (ref >60)
Glucose, Bld: 109 mg/dL — ABNORMAL HIGH (ref 65–99)
POTASSIUM: 4.1 mmol/L (ref 3.5–5.1)
Sodium: 139 mmol/L (ref 135–145)

## 2015-07-04 LAB — T3, FREE: T3 FREE: 2.7 pg/mL (ref 2.0–4.4)

## 2015-07-04 MED ORDER — ASPIRIN 81 MG PO CHEW: 81.0000 mg | CHEWABLE_TABLET | Freq: Every day | ORAL | Status: AC

## 2015-07-04 NOTE — Discharge Planning (Addendum)
Dr. Rennis Golden requested the patient have an outpatient stress test and a 2 week-event monitor.  She has been scheduled for an Outpatient Nuclear Stress Test on September 21st, 2016 at 11:45AM with [name], PA-C. She was instructed to consume no liquids or food after midnight prior to her procedure.  She has an appointment to receive her event monitor at 2:00PM on September 28th, 2016.  The patient was instructed to contact our office at [phone] with any questions or concerns about her procedure.  Signed, Wandra Mannan, PA-C 07/04/2015, 3:18 PM

## 2015-07-04 NOTE — Discharge Summary (Signed)
Physician Discharge Summary  Kristen Maynard QIO:[id number] DOB: 1946/01/04 DOA: 07/02/2015  PCP: Konrad Felix, MD  Admit date: 07/02/2015 Discharge date: 07/04/2015  Time spent: 35 minutes  Recommendations for Outpatient Follow-up:  1. Outpatient ST and 2 week event monitor 2. GI pathogen panel pending at d/c but diarrhea has resolved  Discharge Diagnoses:  Principal Problem:   Palpitations Active Problems:   Elevated troponin   CAD in native artery s/p stent   Essential hypertension   Hyperlipidemia   History of stroke   Discharge Condition: improved  Diet recommendation: cardiac  Filed Weights   07/02/15 2255 07/03/15 0500 07/04/15 0559  Weight: 68.992 kg (152 lb 1.6 oz) 68.448 kg (150 lb 14.4 oz) 68.1 kg (150 lb 2.1 oz)    History of present illness:  Kristen Maynard is a 69 y.o. female with history of CAD status post stenting, hypertension and hyperlipidemia presents to the ER because of palpitations. Patient states last evening patient had palpitations which was continuous lasting almost for 2 hours during which patient felt mildly dizzy and diaphoretic. Denies any chest pain or shortness of breath. In the ER EKG was showing sinus tachycardia rate around 110 bpm. Presently patient is asymptomatic. Denies any previous episodes of similar. Patient has been admitted for further observation. Denies missing her medications. Denies any nausea vomiting abdominal pain or diarrhea. Denies any fever chills or productive cough.  Hospital Course:  1. Palpitations - tele shows NSR. thyroid function tests ok. CTA negative. Cardiology consult-- outpatient ST and monitor 2. CAD status post stenting with mildly elevated troponin - troponin may be mildly limited due to patient's tachycardia. Patient's on anti-plated agent beta blockers and statins which will be continued. 3. Hyperlipidemia on statins. 4. Diarrhea- resolved, pathogen panel  pending  Procedures:    Consultations:  cardiology  Discharge Exam: Filed Vitals:   07/04/15 0559  BP: 131/64  Pulse: 77  Temp: 98.1 F (36.7 C)  Resp: 18    General: A+Ox3, NAD   Discharge Instructions   Discharge Instructions    Diet - low sodium heart healthy    Complete by:  As directed      Discharge instructions    Complete by:  As directed   Outpatient stress test and event monitor Hydrate when working outside in the heat Limit caffeine     Increase activity slowly    Complete by:  As directed           Current Discharge Medication List    START taking these medications   Details  aspirin 81 MG chewable tablet Chew 1 tablet (81 mg total) by mouth daily. Qty: 30 tablet, Refills: 0      CONTINUE these medications which have NOT CHANGED   Details  acetaminophen (TYLENOL) 325 MG tablet Take 650 mg by mouth every 6 (six) hours as needed for moderate pain.    atenolol (TENORMIN) 50 MG tablet Take 50 mg by mouth daily.      Calcium-Magnesium-Vitamin D (CALCIUM 1200+D3 PO) Take 1 capsule by mouth daily.    Cholecalciferol (VITAMIN D) 1000 UNITS capsule Take 1,000 Units by mouth daily.     clopidogrel (PLAVIX) 75 MG tablet Take 75 mg by mouth daily.      CRANBERRY PO Take 1 tablet by mouth daily.    loratadine (CLARITIN) 10 MG tablet Take 10 mg by mouth daily.    losartan (COZAAR) 100 MG tablet Take 100 mg by mouth daily.    Multiple Vitamin (MULTIVITAMIN  WITH MINERALS) TABS tablet Take 1 tablet by mouth daily.    omeprazole (PRILOSEC) 40 MG capsule Take 40 mg by mouth daily.    simvastatin (ZOCOR) 40 MG tablet Take 40 mg by mouth daily at 6 PM.    acyclovir (ZOVIRAX) 400 MG tablet Take 400 mg by mouth 2 (two) times daily between meals as needed (for optic infection).      STOP taking these medications     aspirin 325 MG tablet        Allergies  Allergen Reactions  . Aggrenox [Aspirin-Dipyridamole Er] Other (See Comments)    Headaches  maybe? Told never to take med  . Codeine Other (See Comments)    Confusion  . Sulfa Antibiotics Other (See Comments)    Seizures    Follow-up Information    Follow up with [facility], MD In 1 week.   Specialty:  Family Medicine   Contact information:   [address], [place] [address] [phone]       Follow up with Donato Schultz, MD In 2 weeks.   Specialty:  Cardiology   Contact information:   [address] [place] [address] [phone]        The results of significant diagnostics from this hospitalization (including imaging, microbiology, ancillary and laboratory) are listed below for reference.    Significant Diagnostic Studies: Dg Chest 2 View  07/02/2015   CLINICAL DATA:  Weakness and dizziness for 1 day.  EXAM: CHEST  2 VIEW  COMPARISON:  01/05/2015  FINDINGS: The heart size and mediastinal contours are within normal limits. Both lungs are clear. The visualized skeletal structures are unremarkable.  IMPRESSION: No active cardiopulmonary disease.   Electronically Signed   By: Burman Nieves M.D.   On: 07/02/2015 23:29   Ct Angio Chest Pe W/cm &/or Wo Cm  07/03/2015   CLINICAL DATA:  Chest pain, relieve since arrival. Elevated D-dimer.  EXAM: CT ANGIOGRAPHY CHEST WITH CONTRAST  TECHNIQUE: Multidetector CT imaging of the chest was performed using the standard protocol during bolus administration of intravenous contrast. Multiplanar CT image reconstructions and MIPs were obtained to evaluate the vascular anatomy.  CONTRAST:  OMNIPAQUE IOHEXOL 350 MG/ML SOLN  COMPARISON:  None.  FINDINGS: Technically adequate study with good opacification of the central and segmental pulmonary arteries. No focal filling defects demonstrated. No evidence of significant pulmonary embolus.  Normal heart size. Normal caliber thoracic aorta. Scattered aortic calcification. Great vessel origins are patent. Coronary artery calcifications. Esophagus is  decompressed. Lymph nodes in the mediastinum are not pathologically enlarged.  Evaluation of lungs is limited due to respiratory motion artifact. There is dependent atelectasis in the lungs. No focal consolidation. No pleural effusions. No pneumothorax. Airways appear patent.  Included portions of the upper abdominal organs are grossly unremarkable. Degenerative changes in the spine. No destructive bone lesions.  Review of the MIP images confirms the above findings.  IMPRESSION: No evidence of significant pulmonary embolus. Dependent atelectasis in the lung bases.   Electronically Signed   By: Burman Nieves M.D.   On: 07/03/2015 03:29    Microbiology: No results found for this or any previous visit (from the past 240 hour(s)).   Labs: Basic Metabolic Panel:  Recent Labs Lab 07/02/15 1730 07/03/15 0648 07/04/15 0053  NA 140 139 139  K 3.8 3.8 4.1  CL 109 107 109  CO2 22 23 25   GLUCOSE 121* 101* 109*  BUN 21* 18 13  CREATININE  0.97 0.71 0.78  CALCIUM 8.9 8.4* 8.4*  MG  --   --  2.1   Liver Function Tests:  Recent Labs Lab 07/03/15 0648  AST 24  ALT 21  ALKPHOS 85  BILITOT 0.6  PROT 5.8*  ALBUMIN 3.4*   No results for input(s): LIPASE, AMYLASE in the last 168 hours. No results for input(s): AMMONIA in the last 168 hours. CBC:  Recent Labs Lab 07/02/15 1730 07/03/15 0648 07/04/15 0053  WBC 6.8 4.5 4.0  NEUTROABS  --  3.4  --   HGB 13.2 12.5 12.3  HCT 38.4 36.6 35.9*  MCV 85.5 85.9 86.1  PLT 174 174 149*   Cardiac Enzymes:  Recent Labs Lab 07/03/15 0059 07/03/15 0648 07/03/15 1204  TROPONINI 0.14* 0.09* 0.06*   BNP: BNP (last 3 results) No results for input(s): BNP in the last 8760 hours.  ProBNP (last 3 results) No results for input(s): PROBNP in the last 8760 hours.  CBG:  Recent Labs Lab 07/02/15 1729  GLUCAP 108*       Signed:  Jonnie Kubly  [facility] 07/04/2015, 12:55 PM

## 2015-07-04 NOTE — Progress Notes (Signed)
Patient Name: Kristen Maynard Date of Encounter: 07/04/2015  Principal Problem:   Palpitations Active Problems:   Elevated troponin   CAD in native artery s/p stent   Essential hypertension   Hyperlipidemia   History of stroke    Primary Cardiologist: Dr. Kirke Corin Patient Profile: 69 yo female w/ PMH of CAD(s/p angioplasty w/o stent in 1999; treadmill test in 2011 with no ischemia), HTN, HLD, TIA, and GERD admitted for near syncope, tachycardia, and palpitations. Troponin elevated to 0.14 at admission.  SUBJECTIVE: Feeling well. Denies any chest pain, shortness of breath, or palpitations. Says her diarrhea has resolved and had a solid bowel movement yesterday.  OBJECTIVE Filed Vitals:   07/03/15 0500 07/03/15 1615 07/03/15 2019 07/04/15 0559  BP: 139/52 123/55 140/64 131/64  Pulse: 83  85 77  Temp: 98.6 F (37 C) 98.6 F (37 C) 98.6 F (37 C) 98.1 F (36.7 C)  TempSrc: Oral Oral Oral Oral  Resp:  18 18 18   Height:      Weight: 150 lb 14.4 oz (68.448 kg)   150 lb 2.1 oz (68.1 kg)  SpO2: 96% 95% 98% 97%    Intake/Output Summary (Last 24 hours) at 07/04/15 0909 Last data filed at 07/04/15 0818  Gross per 24 hour  Intake    240 ml  Output    700 ml  Net   -460 ml   Filed Weights   07/02/15 2255 07/03/15 0500 07/04/15 0559  Weight: 152 lb 1.6 oz (68.992 kg) 150 lb 14.4 oz (68.448 kg) 150 lb 2.1 oz (68.1 kg)    PHYSICAL EXAM General: Well developed, well nourished, female in no acute distress. Head: Normocephalic, atraumatic.  Neck: Supple without bruits, JVD not elevated. Lungs:  Resp regular and unlabored, CTA without wheezing or rales. Heart: RRR, S1, S2, no S3, S4, or murmur; no rub. Abdomen: Soft, non-tender, non-distended with normoactive bowel sounds. No hepatomegaly. No rebound/guarding. No obvious abdominal masses. Extremities: No clubbing, cyanosis, or edema. Distal pedal pulses are 2+ bilaterally. Neuro: Alert and oriented X 3. Moves all extremities  spontaneously. Psych: Normal affect.   LABS: CBC: Recent Labs  07/03/15 0648 07/04/15 0053  WBC 4.5 4.0  NEUTROABS 3.4  --   HGB 12.5 12.3  HCT 36.6 35.9*  MCV 85.9 86.1  PLT 174 149*   Basic Metabolic Panel: Recent Labs  07/03/15 0648 07/04/15 0053  NA 139 139  K 3.8 4.1  CL 107 109  CO2 23 25  GLUCOSE 101* 109*  BUN 18 13  CREATININE 0.71 0.78  CALCIUM 8.4* 8.4*  MG  --  2.1   Liver Function Tests: Recent Labs  07/03/15 0648  AST 24  ALT 21  ALKPHOS 85  BILITOT 0.6  PROT 5.8*  ALBUMIN 3.4*   Cardiac Enzymes: Recent Labs  07/03/15 0059 07/03/15 0648 07/03/15 1204  TROPONINI 0.14* 0.09* 0.06*    Recent Labs  07/02/15 1802 07/02/15 2053  TROPIPOC 0.03 0.12*   D-dimer: Recent Labs  07/03/15 0059  DDIMER 0.60*   Thyroid Function Tests: Recent Labs  07/03/15 0059  TSH 1.158  T3FREE 2.7    TELE:  Sinus rhythm with HR in 70's - 80's. No episodes of tachycardia noted. No atopic events.   ECHO: 07/03/2015 Study Conclusions - Left ventricle: The cavity size was normal. Wall thickness was normal. Systolic function was vigorous. The estimated ejection fraction was in the range of 65% to 70%. Wall motion was normal; there were no regional  wall motion abnormalities.  Impressions: - Compared to the prior study, there has been no significant interval change.  Radiology/Studies: Dg Chest 2 View: 07/02/2015   CLINICAL DATA:  Weakness and dizziness for 1 day.  EXAM: CHEST  2 VIEW  COMPARISON:  01/05/2015  FINDINGS: The heart size and mediastinal contours are within normal limits. Both lungs are clear. The visualized skeletal structures are unremarkable.  IMPRESSION: No active cardiopulmonary disease.   Electronically Signed   By: Burman Nieves M.D.   On: 07/02/2015 23:29   Ct Angio Chest Pe W/cm &/or Wo Cm: 07/03/2015   CLINICAL DATA:  Chest pain, relieve since arrival. Elevated D-dimer.  EXAM: CT ANGIOGRAPHY CHEST WITH CONTRAST  TECHNIQUE:  Multidetector CT imaging of the chest was performed using the standard protocol during bolus administration of intravenous contrast. Multiplanar CT image reconstructions and MIPs were obtained to evaluate the vascular anatomy.  CONTRAST:  OMNIPAQUE IOHEXOL 350 MG/ML SOLN  COMPARISON:  None.  FINDINGS: Technically adequate study with good opacification of the central and segmental pulmonary arteries. No focal filling defects demonstrated. No evidence of significant pulmonary embolus.  Normal heart size. Normal caliber thoracic aorta. Scattered aortic calcification. Great vessel origins are patent. Coronary artery calcifications. Esophagus is decompressed. Lymph nodes in the mediastinum are not pathologically enlarged.  Evaluation of lungs is limited due to respiratory motion artifact. There is dependent atelectasis in the lungs. No focal consolidation. No pleural effusions. No pneumothorax. Airways appear patent.  Included portions of the upper abdominal organs are grossly unremarkable. Degenerative changes in the spine. No destructive bone lesions.  Review of the MIP images confirms the above findings.  IMPRESSION: No evidence of significant pulmonary embolus. Dependent atelectasis in the lung bases.   Electronically Signed   By: Burman Nieves M.D.   On: 07/03/2015 03:29     Current Medications:  . aspirin  81 mg Oral Daily  . atenolol  50 mg Oral QHS  . clopidogrel  75 mg Oral Daily  . enoxaparin (LOVENOX) injection  40 mg Subcutaneous Q24H  . loratadine  10 mg Oral Daily  . losartan  100 mg Oral Daily  . multivitamin with minerals  1 tablet Oral Daily  . pantoprazole  40 mg Oral Daily  . simvastatin  40 mg Oral q1800  . sodium chloride  3 mL Intravenous Q12H      ASSESSMENT AND PLAN: 1. Elevated troponin - history of CAD with prior balloon angioplasty in 1999. Last treadmill test in 2011 which showed no signs of ischemia. - Troponin initially at 0.14 and has continued to decline to  0.09 and 0.06 respectively. Thought to be associated with underlying medical illness (diarrhea, tachycardia, and palpitations) and to represent demand ischemia.  - Echo on 07/03/2015 showed EF of 65-70% with no wall motion abnormalities. - Could consider having stress test as an outpatient. - continue ASA, BB, ARB, and Plavix.   2. Diarrhea  - Per Primary Team - According to the patient, this has resolved and she had a formed stool yesterday. She reports the diarrhea started the day after experiencing tachycardia and palpitations.   3. Sinus tachycardia - Currently resolved, with HR in 70's - 80's over the past 24 hours. Patient reports when she was having the palpitations, her pulse felt very irregular and felt as if it were skipping beats. Consider Event Recorder as an outpatient. - Echocardiogram without any abnormalities. - Continue with beta blocker.  4. Essential hypertension  - BP has been  123/55 - 140/64 in the past 24 hours.  5. Hyperlipidemia - continue statin.  Otherwise, per Primary Team.  Signed, Wandra Mannan , PA-C 9:09 AM 07/04/2015 Pager: [phone]

## 2015-07-05 LAB — GI PATHOGEN PANEL BY PCR, STOOL
Campylobacter by PCR: NOT DETECTED
Cryptosporidium by PCR: NOT DETECTED
E COLI (ETEC) LT/ST: NOT DETECTED
E COLI (STEC): NOT DETECTED
E COLI 0157 BY PCR: NOT DETECTED
G lamblia by PCR: NOT DETECTED
Rotavirus A by PCR: NOT DETECTED
SALMONELLA BY PCR: NOT DETECTED
Shigella by PCR: NOT DETECTED

## 2015-07-16 ENCOUNTER — Telehealth (HOSPITAL_COMMUNITY): Payer: Self-pay | Admitting: Radiology

## 2015-07-16 ENCOUNTER — Telehealth (HOSPITAL_COMMUNITY): Payer: Self-pay | Admitting: *Deleted

## 2015-07-16 NOTE — Telephone Encounter (Signed)
Patient given detailed instructions per Myocardial Perfusion Study Information Sheet for test on 07/18/15 at 11:45. Patient notified to arrive 15 minutes early and that it is imperative to arrive on time for appointment to keep from having the test rescheduled.  If you need to cancel or reschedule your appointment, please call the office within 24 hours of your appointment. Failure to do so may result in a cancellation of your appointment, and a $50 no show fee. Patient verbalized understanding. Kristen Maynard

## 2015-07-16 NOTE — Telephone Encounter (Signed)
Patient given detailed instructions per Myocardial Perfusion Study Information Sheet for test on 07/18/15 at 1145. Patient notified to arrive 15 minutes early and that it is imperative to arrive on time for appointment to keep from having the test rescheduled.  If you need to cancel or reschedule your appointment, please call the office within 24 hours of your appointment. Failure to do so may result in a cancellation of your appointment, and a $50 no show fee. Patient verbalized understanding. Melvyn Novas

## 2015-07-17 ENCOUNTER — Encounter: Payer: Self-pay | Admitting: Cardiology

## 2015-07-17 ENCOUNTER — Encounter: Payer: Self-pay | Admitting: Internal Medicine

## 2015-07-18 ENCOUNTER — Ambulatory Visit (HOSPITAL_COMMUNITY): Payer: Medicare Other | Attending: Student

## 2015-07-18 DIAGNOSIS — I10 Essential (primary) hypertension: Secondary | ICD-10-CM | POA: Insufficient documentation

## 2015-07-18 DIAGNOSIS — R002 Palpitations: Secondary | ICD-10-CM | POA: Diagnosis not present

## 2015-07-18 DIAGNOSIS — R778 Other specified abnormalities of plasma proteins: Secondary | ICD-10-CM

## 2015-07-18 DIAGNOSIS — R7989 Other specified abnormal findings of blood chemistry: Secondary | ICD-10-CM | POA: Diagnosis present

## 2015-07-18 LAB — MYOCARDIAL PERFUSION IMAGING
CHL CUP RESTING HR STRESS: 58 {beats}/min
LVDIAVOL: 57 mL
LVSYSVOL: 14 mL
NUC STRESS TID: 1.39
Peak HR: 88 {beats}/min
RATE: 0.32
SDS: 2
SRS: 13
SSS: 12

## 2015-07-18 MED ORDER — TECHNETIUM TC 99M SESTAMIBI GENERIC - CARDIOLITE
32.9000 | Freq: Once | INTRAVENOUS | Status: AC | PRN
  Administered 2015-07-18: 32.9 via INTRAVENOUS

## 2015-07-18 MED ORDER — TECHNETIUM TC 99M SESTAMIBI GENERIC - CARDIOLITE
10.9000 | Freq: Once | INTRAVENOUS | Status: AC | PRN
  Administered 2015-07-18: 10.9 via INTRAVENOUS

## 2015-07-18 MED ORDER — REGADENOSON 0.4 MG/5ML IV SOLN
0.4000 mg | Freq: Once | INTRAVENOUS | Status: AC
  Administered 2015-07-18: 0.4 mg via INTRAVENOUS

## 2015-07-25 ENCOUNTER — Encounter: Payer: Medicare Other | Admitting: Physician Assistant

## 2015-07-31 ENCOUNTER — Other Ambulatory Visit: Payer: Self-pay | Admitting: Student

## 2015-08-01 ENCOUNTER — Ambulatory Visit (INDEPENDENT_AMBULATORY_CARE_PROVIDER_SITE_OTHER): Payer: Medicare Other

## 2015-08-01 DIAGNOSIS — R002 Palpitations: Secondary | ICD-10-CM | POA: Diagnosis not present

## 2015-08-08 ENCOUNTER — Ambulatory Visit: Payer: Medicare Other | Admitting: Physician Assistant

## 2015-08-13 ENCOUNTER — Telehealth: Payer: Self-pay | Admitting: *Deleted

## 2015-08-13 NOTE — Telephone Encounter (Signed)
Patient had called in earlier.  She had not recorded any significant events on her 14 day cardiac event monitor, which, was applied 08/01/2015. Reviewed with Avie ArenasPam Fleming, RN.  Patient to extend monitor service to a full 30 day cardiac event monitor at no additional charge.  Mitch from [employer] notified.

## 2015-08-28 NOTE — Progress Notes (Signed)
Cardiology Office Note   Date:  08/29/2015   ID:  Kristen Maynard, DOB 02-Nov-1945, MRN [id number]  PCP:  Konrad Felix, MD  Cardiologist:  Previously Dr. Lorine Bears in  >> Dr. Donato Schultz   Electrophysiologist:  n/a  Chief Complaint  Patient presents with  . Hospitalization Follow-up    Elevated Troponin 2/2 demand ischemia  . Other    Event Monitor pending     History of Present Illness: Kristen Maynard is a 69 y.o. female with a hx of CAD status post POBA in 1999 by Dr. Juanda Chance, HTN, HL, prior CVA, GERD. Previously followed by Dr. Kirke Corin in 2011.  Admitted 9/16 with rapid palpitations and sinus tachycardia for 2 hours and mildly elevated troponin in the setting of diarrhea. Troponin levels were minimally elevated without clear trend. This was suspected to be related to demand ischemia. Echocardiogram demonstrated vigorous LV function and normal wall motion. TSH was normal. Chest CT was negative for pulmonary embolism. Outpatient stress test was recommended. This was performed 07/18/15 and was negative for scar or ischemia with normal EF. Event monitor was also recommended to rule out atrial fibrillation.  Preliminary strips were reviewed by me today.  Available monitor strips demonstrate NSR; no AFib.  Returns for FU.  She denies any further palpitations.  The patient denies chest pain, shortness of breath, syncope, orthopnea, PND or significant pedal edema.  She currently has L knee pain related to a torn meniscus.     Studies/Reports Reviewed Today:  Myoview 07/18/15 EF 75%, no ischemia or scar, normal study  Echo 07/03/15 Vigorous LVF, EF 65-70%, normal wall motion  Chest CTA 07/03/15 No pulmonary embolism   Past Medical History  Diagnosis Date  . CAD (coronary artery disease) 1999    status post angioplasty w/out stent placement in 1999 done by Dr. Juanda Chance. According to pt, a stent was not placed due to small size vessel. No cardiac events since then.  . Mitral valve  prolapse   . HTN (hypertension)   . HLD (hyperlipidemia)   . TIA (transient ischemic attack)   . GERD (gastroesophageal reflux disease)   . Osteoporosis     Past Surgical History  Procedure Laterality Date  . Cardiac catheterization       Current Outpatient Prescriptions  Medication Sig Dispense Refill  . acetaminophen (TYLENOL) 325 MG tablet Take 650 mg by mouth every 6 (six) hours as needed for moderate pain.    Marland Kitchen acyclovir (ZOVIRAX) 400 MG tablet Take 400 mg by mouth 2 (two) times daily between meals as needed (for optic infection).    Marland Kitchen aspirin 81 MG chewable tablet Chew 1 tablet (81 mg total) by mouth daily. 30 tablet 0  . atenolol (TENORMIN) 50 MG tablet Take 50 mg by mouth daily.      . Calcium-Magnesium-Vitamin D (CALCIUM 1200+D3 PO) Take 1 capsule by mouth daily.    . Cholecalciferol (VITAMIN D) 1000 UNITS capsule Take 1,000 Units by mouth daily.     . clopidogrel (PLAVIX) 75 MG tablet Take 75 mg by mouth daily.      Marland Kitchen CRANBERRY PO Take 1 tablet by mouth daily.    Marland Kitchen loratadine (CLARITIN) 10 MG tablet Take 10 mg by mouth daily.    Marland Kitchen losartan (COZAAR) 100 MG tablet Take 100 mg by mouth daily.    . Multiple Vitamin (MULTIVITAMIN WITH MINERALS) TABS tablet Take 1 tablet by mouth daily.    Marland Kitchen omeprazole (PRILOSEC) 40 MG capsule Take 40 mg  by mouth daily.    . simvastatin (ZOCOR) 40 MG tablet Take 40 mg by mouth daily at 6 PM.     No current facility-administered medications for this visit.    Allergies:   Aggrenox; Codeine; and Sulfa antibiotics    Social History:   Social History   Social History  . Marital Status: Married    Spouse Name: N/A  . Number of Children: N/A  . Years of Education: N/A   Social History Main Topics  . Smoking status: Never Smoker   . Smokeless tobacco: None  . Alcohol Use: No  . Drug Use: No  . Sexual Activity: Not Asked   Other Topics Concern  . None   Social History Narrative   Retired.      Family History:   Family History    Problem Relation Age of Onset  . Lung cancer Mother   . COPD Father       ROS:   Please see the history of present illness.   Review of Systems  Respiratory: Positive for snoring.   All other systems reviewed and are negative.     PHYSICAL EXAM: VS:  BP 150/60 mmHg  Pulse 71  Ht  (1.651 m)  Wt 155 lb 6.4 oz (70.489 kg)  BMI 25.86 kg/m2    Wt Readings from Last 3 Encounters:  08/29/15 155 lb 6.4 oz (70.489 kg)  07/18/15 158 lb (71.668 kg)  07/04/15 150 lb 2.1 oz (68.1 kg)     GEN: Well nourished, well developed, in no acute distress HEENT: normal Neck: no JVD, no carotid bruits, no masses Cardiac:  Normal S1/S2, RRR; no murmur ,  no rubs or gallops, no edema   Respiratory:  clear to auscultation bilaterally, no wheezing, rhonchi or rales. GI: soft, nontender, nondistended, + BS MS: no deformity or atrophy Skin: warm and dry  Neuro:  CNs II-XII intact, Strength and sensation are intact Psych: Normal affect   EKG:  EKG is ordered today.  It demonstrates:   NSR, HR 71, normal axis, QTc 456 ms.   Recent Labs: 07/03/2015: ALT 21; TSH 1.158 07/04/2015: BUN 13; Creatinine, Ser 0.78; Hemoglobin 12.3; Magnesium 2.1; Platelets 149*; Potassium 4.1; Sodium 139    Lipid Panel No results found for: CHOL, TRIG, HDL, CHOLHDL, VLDL, LDLCALC, LDLDIRECT    ASSESSMENT AND PLAN:  1. CAD:  Hx of POBA in 1999 by Dr. Juanda Chance.  Recent admit with minimally elevated Troponin levels in the setting of rapid palpitations and diarrhea.  This was felt to be demand ischemia.  She underwent OP stress test that was neg for ischemia.  EF by echo was normal during her hospital stay.  She denies angina.  Continue ASA, statin, Plavix, beta-blocker, angiotensin receptor blocker.  2. Palpitations:  Recent event monitor neg for AFib.  Final report is pending. CHADS2-VASc=6. She would need long term anticoagulation if AFib is ever documented.     3. HTN:  BP elevated today. It is generally well  controlled.  Continue to monitor.    4. Hyperlipidemia:  Followed by PCP.  Continue statin.   5. Hx of CVA:  Continue ASA, Plavix.    6. Snoring:  She has a hx of snoring and witnessed apnea.  She should have a sleep study.  She would like to get this arranged with her PCP in [place] instead of coming to [place].       Medication Changes: Current medicines are reviewed at length with the patient  today.  Concerns regarding medicines are as outlined above.  The following changes have been made:   Discontinued Medications   No medications on file   Modified Medications   No medications on file   New Prescriptions   No medications on file   Labs/ tests ordered today include:   Orders Placed This Encounter  Procedures  . EKG 12-Lead      Disposition:    FU with Dr. Donato SchultzMark Skains 6 mos.     Signed, Brynda RimScott Laurinda Carreno, PA-C, MHS 08/29/2015 1:11 PM    [facility] [address], [place], [place]  [address] Phone: [phone]; Fax: [phone]

## 2015-08-29 ENCOUNTER — Encounter: Payer: Self-pay | Admitting: Physician Assistant

## 2015-08-29 ENCOUNTER — Ambulatory Visit (INDEPENDENT_AMBULATORY_CARE_PROVIDER_SITE_OTHER): Payer: Medicare Other | Admitting: Physician Assistant

## 2015-08-29 VITALS — BP 150/60 | HR 71 | Ht 65.0 in | Wt 155.4 lb

## 2015-08-29 DIAGNOSIS — I2510 Atherosclerotic heart disease of native coronary artery without angina pectoris: Secondary | ICD-10-CM

## 2015-08-29 DIAGNOSIS — I10 Essential (primary) hypertension: Secondary | ICD-10-CM

## 2015-08-29 DIAGNOSIS — R0683 Snoring: Secondary | ICD-10-CM

## 2015-08-29 DIAGNOSIS — Z8673 Personal history of transient ischemic attack (TIA), and cerebral infarction without residual deficits: Secondary | ICD-10-CM

## 2015-08-29 DIAGNOSIS — E785 Hyperlipidemia, unspecified: Secondary | ICD-10-CM | POA: Diagnosis not present

## 2015-08-29 DIAGNOSIS — R002 Palpitations: Secondary | ICD-10-CM

## 2015-08-29 NOTE — Patient Instructions (Signed)
Medication Instructions:  Your physician recommends that you continue on your current medications as directed. Please refer to the Current Medication list given to you today.   Labwork: NONE  Testing/Procedures: NONE  Follow-Up: DR. Anne FuSKAINS 6 MONTHS; WE WILL CALL YOU A COUPLE OF MONTHS EARLIER TO MAKE APPT  Any Other Special Instructions Will Be Listed Below (If Applicable). PER SCOTT WEAVER, PAC;  ASK YOUR PCP TO ARRANGE SLEEP STUDY AT YOUR NEXT VISIT WITH PCP    If you need a refill on your cardiac medications before your next appointment, please call your pharmacy.

## 2015-09-25 ENCOUNTER — Telehealth: Payer: Self-pay | Admitting: Cardiology

## 2015-09-25 NOTE — Telephone Encounter (Signed)
New Message    Office calling to find out if we received the surgical clearance form they faxed. Please call back and advise.

## 2015-09-25 NOTE — Telephone Encounter (Signed)
Called and confirmed fax number  

## 2015-09-25 NOTE — Telephone Encounter (Signed)
Faxing surgical clearance to [phone]

## 2015-12-20 DIAGNOSIS — R300 Dysuria: Secondary | ICD-10-CM | POA: Diagnosis not present

## 2015-12-20 DIAGNOSIS — N898 Other specified noninflammatory disorders of vagina: Secondary | ICD-10-CM | POA: Diagnosis not present

## 2016-01-16 DIAGNOSIS — M19072 Primary osteoarthritis, left ankle and foot: Secondary | ICD-10-CM | POA: Diagnosis not present

## 2016-01-16 DIAGNOSIS — Z9889 Other specified postprocedural states: Secondary | ICD-10-CM | POA: Diagnosis not present

## 2016-04-12 DIAGNOSIS — J069 Acute upper respiratory infection, unspecified: Secondary | ICD-10-CM | POA: Diagnosis not present

## 2016-04-13 IMAGING — NM NM MYOCAR MULTI W/ SPECT
4 series · 24 of 24 positions shown · non-contrast
Comparison: none

[Series 1: rest · 6.51mm/px · 6 of 64 frames shown (1 of 2)]
[frame 6/64]
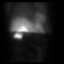
[frame 16/64]
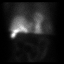
[frame 27/64]
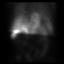
[frame 38/64]
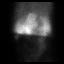
[frame 48/64]
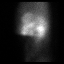
[frame 59/64]
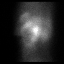

[Series 2: rest · 6.51mm/px · 6 of 64 frames shown (2 of 2)]
[frame 6/64]
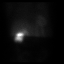
[frame 16/64]
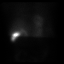
[frame 27/64]
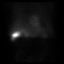
[frame 38/64]
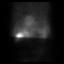
[frame 48/64]
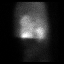
[frame 59/64]
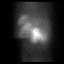

[Series 3: stress · 6.51mm/px · 6 of 64 frames shown (1 of 2)]
[frame 6/64]
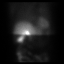
[frame 16/64]
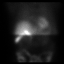
[frame 27/64]
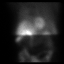
[frame 38/64]
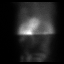
[frame 48/64]
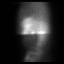
[frame 59/64]
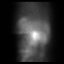

[Series 3: stress · 6.51mm/px · 6 of 512 frames shown (2 of 2)]
[frame 43/512]
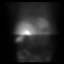
[frame 128/512]
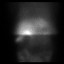
[frame 214/512]
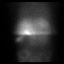
[frame 299/512]
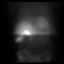
[frame 384/512]
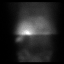
[frame 470/512]
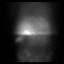

[24 of 24 positions shown; findings below may reference images not displayed]

Canned report from images found in remote index.

Refer to host system for actual result text.

## 2016-05-09 DIAGNOSIS — H2512 Age-related nuclear cataract, left eye: Secondary | ICD-10-CM | POA: Diagnosis not present

## 2016-05-09 DIAGNOSIS — Z961 Presence of intraocular lens: Secondary | ICD-10-CM | POA: Diagnosis not present

## 2016-07-08 DIAGNOSIS — N182 Chronic kidney disease, stage 2 (mild): Secondary | ICD-10-CM | POA: Diagnosis not present

## 2016-07-08 DIAGNOSIS — R8290 Unspecified abnormal findings in urine: Secondary | ICD-10-CM | POA: Diagnosis not present

## 2016-07-08 DIAGNOSIS — Z1211 Encounter for screening for malignant neoplasm of colon: Secondary | ICD-10-CM | POA: Diagnosis not present

## 2016-07-08 DIAGNOSIS — Z0000 Encounter for general adult medical examination without abnormal findings: Secondary | ICD-10-CM | POA: Diagnosis not present

## 2016-07-08 DIAGNOSIS — E785 Hyperlipidemia, unspecified: Secondary | ICD-10-CM | POA: Diagnosis not present

## 2016-07-08 DIAGNOSIS — Z1231 Encounter for screening mammogram for malignant neoplasm of breast: Secondary | ICD-10-CM | POA: Diagnosis not present

## 2016-07-08 DIAGNOSIS — E559 Vitamin D deficiency, unspecified: Secondary | ICD-10-CM | POA: Diagnosis not present

## 2016-07-08 DIAGNOSIS — I1310 Hypertensive heart and chronic kidney disease without heart failure, with stage 1 through stage 4 chronic kidney disease, or unspecified chronic kidney disease: Secondary | ICD-10-CM | POA: Diagnosis not present

## 2016-07-08 DIAGNOSIS — N959 Unspecified menopausal and perimenopausal disorder: Secondary | ICD-10-CM | POA: Diagnosis not present

## 2016-07-08 DIAGNOSIS — E663 Overweight: Secondary | ICD-10-CM | POA: Diagnosis not present

## 2016-07-08 DIAGNOSIS — Z9181 History of falling: Secondary | ICD-10-CM | POA: Diagnosis not present

## 2016-07-08 DIAGNOSIS — Z1389 Encounter for screening for other disorder: Secondary | ICD-10-CM | POA: Diagnosis not present

## 2016-07-23 DIAGNOSIS — N959 Unspecified menopausal and perimenopausal disorder: Secondary | ICD-10-CM | POA: Diagnosis not present

## 2016-07-23 DIAGNOSIS — M85862 Other specified disorders of bone density and structure, left lower leg: Secondary | ICD-10-CM | POA: Diagnosis not present

## 2016-07-23 DIAGNOSIS — Z1231 Encounter for screening mammogram for malignant neoplasm of breast: Secondary | ICD-10-CM | POA: Diagnosis not present

## 2016-07-24 DIAGNOSIS — M810 Age-related osteoporosis without current pathological fracture: Secondary | ICD-10-CM | POA: Diagnosis not present

## 2016-07-24 DIAGNOSIS — N182 Chronic kidney disease, stage 2 (mild): Secondary | ICD-10-CM | POA: Diagnosis not present

## 2016-07-24 DIAGNOSIS — Z79899 Other long term (current) drug therapy: Secondary | ICD-10-CM | POA: Diagnosis not present

## 2016-07-24 DIAGNOSIS — I1310 Hypertensive heart and chronic kidney disease without heart failure, with stage 1 through stage 4 chronic kidney disease, or unspecified chronic kidney disease: Secondary | ICD-10-CM | POA: Diagnosis not present

## 2016-07-24 DIAGNOSIS — E785 Hyperlipidemia, unspecified: Secondary | ICD-10-CM | POA: Diagnosis not present

## 2016-07-24 DIAGNOSIS — E559 Vitamin D deficiency, unspecified: Secondary | ICD-10-CM | POA: Diagnosis not present

## 2016-07-24 DIAGNOSIS — Z87898 Personal history of other specified conditions: Secondary | ICD-10-CM | POA: Diagnosis not present

## 2016-07-24 DIAGNOSIS — Z23 Encounter for immunization: Secondary | ICD-10-CM | POA: Diagnosis not present

## 2016-07-24 DIAGNOSIS — K210 Gastro-esophageal reflux disease with esophagitis: Secondary | ICD-10-CM | POA: Diagnosis not present

## 2016-07-27 DIAGNOSIS — Z1211 Encounter for screening for malignant neoplasm of colon: Secondary | ICD-10-CM | POA: Diagnosis not present

## 2016-08-12 DIAGNOSIS — Z136 Encounter for screening for cardiovascular disorders: Secondary | ICD-10-CM | POA: Diagnosis not present

## 2016-08-12 DIAGNOSIS — Z0000 Encounter for general adult medical examination without abnormal findings: Secondary | ICD-10-CM | POA: Diagnosis not present

## 2017-02-10 DIAGNOSIS — K210 Gastro-esophageal reflux disease with esophagitis: Secondary | ICD-10-CM | POA: Diagnosis not present

## 2017-02-10 DIAGNOSIS — N182 Chronic kidney disease, stage 2 (mild): Secondary | ICD-10-CM | POA: Diagnosis not present

## 2017-02-10 DIAGNOSIS — I1310 Hypertensive heart and chronic kidney disease without heart failure, with stage 1 through stage 4 chronic kidney disease, or unspecified chronic kidney disease: Secondary | ICD-10-CM | POA: Diagnosis not present

## 2017-02-10 DIAGNOSIS — E785 Hyperlipidemia, unspecified: Secondary | ICD-10-CM | POA: Diagnosis not present

## 2017-02-10 DIAGNOSIS — E559 Vitamin D deficiency, unspecified: Secondary | ICD-10-CM | POA: Diagnosis not present

## 2017-05-12 DIAGNOSIS — H2512 Age-related nuclear cataract, left eye: Secondary | ICD-10-CM | POA: Diagnosis not present

## 2017-05-12 DIAGNOSIS — Z8619 Personal history of other infectious and parasitic diseases: Secondary | ICD-10-CM | POA: Diagnosis not present

## 2017-05-12 DIAGNOSIS — H43811 Vitreous degeneration, right eye: Secondary | ICD-10-CM | POA: Diagnosis not present

## 2017-05-12 DIAGNOSIS — Z961 Presence of intraocular lens: Secondary | ICD-10-CM | POA: Diagnosis not present

## 2017-08-13 DIAGNOSIS — Z0000 Encounter for general adult medical examination without abnormal findings: Secondary | ICD-10-CM | POA: Diagnosis not present

## 2017-08-13 DIAGNOSIS — E785 Hyperlipidemia, unspecified: Secondary | ICD-10-CM | POA: Diagnosis not present

## 2017-08-13 DIAGNOSIS — Z79899 Other long term (current) drug therapy: Secondary | ICD-10-CM | POA: Diagnosis not present

## 2017-08-13 DIAGNOSIS — I1310 Hypertensive heart and chronic kidney disease without heart failure, with stage 1 through stage 4 chronic kidney disease, or unspecified chronic kidney disease: Secondary | ICD-10-CM | POA: Diagnosis not present

## 2017-08-13 DIAGNOSIS — E559 Vitamin D deficiency, unspecified: Secondary | ICD-10-CM | POA: Diagnosis not present

## 2017-08-13 DIAGNOSIS — N182 Chronic kidney disease, stage 2 (mild): Secondary | ICD-10-CM | POA: Diagnosis not present

## 2017-08-18 DIAGNOSIS — Z1231 Encounter for screening mammogram for malignant neoplasm of breast: Secondary | ICD-10-CM | POA: Diagnosis not present

## 2017-08-21 DIAGNOSIS — N390 Urinary tract infection, site not specified: Secondary | ICD-10-CM | POA: Diagnosis not present

## 2017-08-21 DIAGNOSIS — S335XXA Sprain of ligaments of lumbar spine, initial encounter: Secondary | ICD-10-CM | POA: Diagnosis not present

## 2017-08-25 DIAGNOSIS — E785 Hyperlipidemia, unspecified: Secondary | ICD-10-CM | POA: Diagnosis not present

## 2017-08-25 DIAGNOSIS — M8580 Other specified disorders of bone density and structure, unspecified site: Secondary | ICD-10-CM | POA: Diagnosis not present

## 2017-08-25 DIAGNOSIS — K210 Gastro-esophageal reflux disease with esophagitis: Secondary | ICD-10-CM | POA: Diagnosis not present

## 2017-08-25 DIAGNOSIS — I10 Essential (primary) hypertension: Secondary | ICD-10-CM | POA: Diagnosis not present

## 2018-01-08 DIAGNOSIS — E785 Hyperlipidemia, unspecified: Secondary | ICD-10-CM | POA: Diagnosis not present

## 2018-01-08 DIAGNOSIS — I10 Essential (primary) hypertension: Secondary | ICD-10-CM | POA: Diagnosis not present

## 2018-01-08 DIAGNOSIS — M8580 Other specified disorders of bone density and structure, unspecified site: Secondary | ICD-10-CM | POA: Diagnosis not present

## 2018-01-08 DIAGNOSIS — R300 Dysuria: Secondary | ICD-10-CM | POA: Diagnosis not present

## 2018-01-08 DIAGNOSIS — K210 Gastro-esophageal reflux disease with esophagitis: Secondary | ICD-10-CM | POA: Diagnosis not present

## 2018-01-21 DIAGNOSIS — H43813 Vitreous degeneration, bilateral: Secondary | ICD-10-CM

## 2018-01-21 DIAGNOSIS — H25812 Combined forms of age-related cataract, left eye: Secondary | ICD-10-CM | POA: Insufficient documentation

## 2018-01-21 HISTORY — DX: Vitreous degeneration, bilateral: H43.813

## 2018-01-21 HISTORY — DX: Combined forms of age-related cataract, left eye: H25.812

## 2018-01-25 DIAGNOSIS — N390 Urinary tract infection, site not specified: Secondary | ICD-10-CM | POA: Diagnosis not present

## 2018-05-14 DIAGNOSIS — K3532 Acute appendicitis with perforation and localized peritonitis, without abscess: Secondary | ICD-10-CM | POA: Diagnosis not present

## 2018-05-14 DIAGNOSIS — E860 Dehydration: Secondary | ICD-10-CM | POA: Diagnosis not present

## 2018-05-14 DIAGNOSIS — R112 Nausea with vomiting, unspecified: Secondary | ICD-10-CM | POA: Diagnosis not present

## 2018-05-14 DIAGNOSIS — R1031 Right lower quadrant pain: Secondary | ICD-10-CM | POA: Diagnosis not present

## 2018-05-14 DIAGNOSIS — R109 Unspecified abdominal pain: Secondary | ICD-10-CM | POA: Diagnosis not present

## 2018-05-14 DIAGNOSIS — R1110 Vomiting, unspecified: Secondary | ICD-10-CM | POA: Diagnosis not present

## 2018-05-15 DIAGNOSIS — R1031 Right lower quadrant pain: Secondary | ICD-10-CM | POA: Diagnosis not present

## 2018-05-16 DIAGNOSIS — N179 Acute kidney failure, unspecified: Secondary | ICD-10-CM | POA: Diagnosis not present

## 2018-05-16 DIAGNOSIS — K3580 Unspecified acute appendicitis: Secondary | ICD-10-CM | POA: Diagnosis not present

## 2018-05-16 DIAGNOSIS — I129 Hypertensive chronic kidney disease with stage 1 through stage 4 chronic kidney disease, or unspecified chronic kidney disease: Secondary | ICD-10-CM | POA: Diagnosis not present

## 2018-05-16 DIAGNOSIS — I2510 Atherosclerotic heart disease of native coronary artery without angina pectoris: Secondary | ICD-10-CM | POA: Diagnosis not present

## 2018-05-16 DIAGNOSIS — K598 Other specified functional intestinal disorders: Secondary | ICD-10-CM | POA: Diagnosis not present

## 2018-05-16 DIAGNOSIS — K3531 Acute appendicitis with localized peritonitis and gangrene, without perforation: Secondary | ICD-10-CM | POA: Diagnosis not present

## 2018-05-16 DIAGNOSIS — Z7902 Long term (current) use of antithrombotics/antiplatelets: Secondary | ICD-10-CM | POA: Diagnosis not present

## 2018-05-16 DIAGNOSIS — Z9861 Coronary angioplasty status: Secondary | ICD-10-CM | POA: Diagnosis not present

## 2018-05-16 DIAGNOSIS — I252 Old myocardial infarction: Secondary | ICD-10-CM | POA: Diagnosis not present

## 2018-05-16 DIAGNOSIS — Z79899 Other long term (current) drug therapy: Secondary | ICD-10-CM | POA: Diagnosis not present

## 2018-05-16 DIAGNOSIS — E876 Hypokalemia: Secondary | ICD-10-CM | POA: Diagnosis not present

## 2018-05-16 DIAGNOSIS — N182 Chronic kidney disease, stage 2 (mild): Secondary | ICD-10-CM | POA: Diagnosis not present

## 2018-05-16 DIAGNOSIS — R740 Nonspecific elevation of levels of transaminase and lactic acid dehydrogenase [LDH]: Secondary | ICD-10-CM | POA: Diagnosis not present

## 2018-05-16 DIAGNOSIS — K3533 Acute appendicitis with perforation and localized peritonitis, with abscess: Secondary | ICD-10-CM | POA: Diagnosis not present

## 2018-05-16 DIAGNOSIS — Z7982 Long term (current) use of aspirin: Secondary | ICD-10-CM | POA: Diagnosis not present

## 2018-05-16 DIAGNOSIS — M7989 Other specified soft tissue disorders: Secondary | ICD-10-CM | POA: Diagnosis not present

## 2018-05-16 DIAGNOSIS — G4733 Obstructive sleep apnea (adult) (pediatric): Secondary | ICD-10-CM | POA: Diagnosis not present

## 2018-05-16 DIAGNOSIS — Z8673 Personal history of transient ischemic attack (TIA), and cerebral infarction without residual deficits: Secondary | ICD-10-CM | POA: Diagnosis not present

## 2018-05-16 DIAGNOSIS — R112 Nausea with vomiting, unspecified: Secondary | ICD-10-CM | POA: Diagnosis not present

## 2018-05-17 DIAGNOSIS — I2510 Atherosclerotic heart disease of native coronary artery without angina pectoris: Secondary | ICD-10-CM | POA: Diagnosis not present

## 2018-05-17 DIAGNOSIS — K3533 Acute appendicitis with perforation and localized peritonitis, with abscess: Secondary | ICD-10-CM | POA: Diagnosis not present

## 2018-05-17 DIAGNOSIS — K3531 Acute appendicitis with localized peritonitis and gangrene, without perforation: Secondary | ICD-10-CM | POA: Diagnosis not present

## 2018-05-17 DIAGNOSIS — R112 Nausea with vomiting, unspecified: Secondary | ICD-10-CM | POA: Diagnosis not present

## 2018-05-17 DIAGNOSIS — N179 Acute kidney failure, unspecified: Secondary | ICD-10-CM | POA: Diagnosis not present

## 2018-05-18 DIAGNOSIS — I2510 Atherosclerotic heart disease of native coronary artery without angina pectoris: Secondary | ICD-10-CM | POA: Diagnosis not present

## 2018-05-18 DIAGNOSIS — N179 Acute kidney failure, unspecified: Secondary | ICD-10-CM | POA: Diagnosis not present

## 2018-05-18 DIAGNOSIS — K3531 Acute appendicitis with localized peritonitis and gangrene, without perforation: Secondary | ICD-10-CM | POA: Diagnosis not present

## 2018-05-18 DIAGNOSIS — K3533 Acute appendicitis with perforation and localized peritonitis, with abscess: Secondary | ICD-10-CM | POA: Diagnosis not present

## 2018-05-19 DIAGNOSIS — K3531 Acute appendicitis with localized peritonitis and gangrene, without perforation: Secondary | ICD-10-CM | POA: Diagnosis not present

## 2018-05-19 DIAGNOSIS — I2510 Atherosclerotic heart disease of native coronary artery without angina pectoris: Secondary | ICD-10-CM | POA: Diagnosis not present

## 2018-05-19 DIAGNOSIS — K3533 Acute appendicitis with perforation and localized peritonitis, with abscess: Secondary | ICD-10-CM | POA: Diagnosis not present

## 2018-05-19 DIAGNOSIS — N179 Acute kidney failure, unspecified: Secondary | ICD-10-CM | POA: Diagnosis not present

## 2018-05-20 DIAGNOSIS — K3533 Acute appendicitis with perforation and localized peritonitis, with abscess: Secondary | ICD-10-CM | POA: Diagnosis not present

## 2018-05-20 DIAGNOSIS — K598 Other specified functional intestinal disorders: Secondary | ICD-10-CM | POA: Diagnosis not present

## 2018-05-20 DIAGNOSIS — K3531 Acute appendicitis with localized peritonitis and gangrene, without perforation: Secondary | ICD-10-CM | POA: Diagnosis not present

## 2018-05-20 DIAGNOSIS — I2510 Atherosclerotic heart disease of native coronary artery without angina pectoris: Secondary | ICD-10-CM | POA: Diagnosis not present

## 2018-05-20 DIAGNOSIS — N179 Acute kidney failure, unspecified: Secondary | ICD-10-CM | POA: Diagnosis not present

## 2018-05-21 DIAGNOSIS — N179 Acute kidney failure, unspecified: Secondary | ICD-10-CM | POA: Diagnosis not present

## 2018-05-21 DIAGNOSIS — I2510 Atherosclerotic heart disease of native coronary artery without angina pectoris: Secondary | ICD-10-CM | POA: Diagnosis not present

## 2018-05-21 DIAGNOSIS — K3531 Acute appendicitis with localized peritonitis and gangrene, without perforation: Secondary | ICD-10-CM | POA: Diagnosis not present

## 2018-05-21 DIAGNOSIS — K3533 Acute appendicitis with perforation and localized peritonitis, with abscess: Secondary | ICD-10-CM | POA: Diagnosis not present

## 2018-05-22 DIAGNOSIS — N179 Acute kidney failure, unspecified: Secondary | ICD-10-CM | POA: Diagnosis not present

## 2018-05-22 DIAGNOSIS — I2510 Atherosclerotic heart disease of native coronary artery without angina pectoris: Secondary | ICD-10-CM | POA: Diagnosis not present

## 2018-05-22 DIAGNOSIS — K3531 Acute appendicitis with localized peritonitis and gangrene, without perforation: Secondary | ICD-10-CM | POA: Diagnosis not present

## 2018-05-22 DIAGNOSIS — K3533 Acute appendicitis with perforation and localized peritonitis, with abscess: Secondary | ICD-10-CM | POA: Diagnosis not present

## 2018-05-24 DIAGNOSIS — Z48815 Encounter for surgical aftercare following surgery on the digestive system: Secondary | ICD-10-CM | POA: Diagnosis not present

## 2018-05-24 DIAGNOSIS — I129 Hypertensive chronic kidney disease with stage 1 through stage 4 chronic kidney disease, or unspecified chronic kidney disease: Secondary | ICD-10-CM | POA: Diagnosis not present

## 2018-05-24 DIAGNOSIS — N182 Chronic kidney disease, stage 2 (mild): Secondary | ICD-10-CM | POA: Diagnosis not present

## 2018-05-24 DIAGNOSIS — I2510 Atherosclerotic heart disease of native coronary artery without angina pectoris: Secondary | ICD-10-CM | POA: Diagnosis not present

## 2018-05-24 DIAGNOSIS — K3533 Acute appendicitis with perforation and localized peritonitis, with abscess: Secondary | ICD-10-CM | POA: Diagnosis not present

## 2018-05-27 DIAGNOSIS — Z09 Encounter for follow-up examination after completed treatment for conditions other than malignant neoplasm: Secondary | ICD-10-CM | POA: Insufficient documentation

## 2018-05-27 HISTORY — DX: Encounter for follow-up examination after completed treatment for conditions other than malignant neoplasm: Z09

## 2018-06-01 DIAGNOSIS — Z9049 Acquired absence of other specified parts of digestive tract: Secondary | ICD-10-CM | POA: Diagnosis not present

## 2018-06-01 DIAGNOSIS — Z9189 Other specified personal risk factors, not elsewhere classified: Secondary | ICD-10-CM | POA: Diagnosis not present

## 2018-06-01 DIAGNOSIS — K3532 Acute appendicitis with perforation and localized peritonitis, without abscess: Secondary | ICD-10-CM | POA: Diagnosis not present

## 2018-06-01 DIAGNOSIS — M8580 Other specified disorders of bone density and structure, unspecified site: Secondary | ICD-10-CM | POA: Diagnosis not present

## 2018-06-01 DIAGNOSIS — I10 Essential (primary) hypertension: Secondary | ICD-10-CM | POA: Diagnosis not present

## 2018-06-01 DIAGNOSIS — K210 Gastro-esophageal reflux disease with esophagitis: Secondary | ICD-10-CM | POA: Diagnosis not present

## 2018-06-01 DIAGNOSIS — E785 Hyperlipidemia, unspecified: Secondary | ICD-10-CM | POA: Diagnosis not present

## 2018-07-07 DIAGNOSIS — R7989 Other specified abnormal findings of blood chemistry: Secondary | ICD-10-CM | POA: Diagnosis not present

## 2018-07-07 DIAGNOSIS — R7309 Other abnormal glucose: Secondary | ICD-10-CM | POA: Diagnosis not present

## 2018-07-07 DIAGNOSIS — E785 Hyperlipidemia, unspecified: Secondary | ICD-10-CM | POA: Diagnosis not present

## 2018-07-27 DIAGNOSIS — L821 Other seborrheic keratosis: Secondary | ICD-10-CM | POA: Diagnosis not present

## 2018-12-02 DIAGNOSIS — B0053 Herpesviral conjunctivitis: Secondary | ICD-10-CM | POA: Diagnosis not present

## 2018-12-03 DIAGNOSIS — M8589 Other specified disorders of bone density and structure, multiple sites: Secondary | ICD-10-CM | POA: Diagnosis not present

## 2018-12-03 DIAGNOSIS — Z1231 Encounter for screening mammogram for malignant neoplasm of breast: Secondary | ICD-10-CM | POA: Diagnosis not present

## 2018-12-03 DIAGNOSIS — M85852 Other specified disorders of bone density and structure, left thigh: Secondary | ICD-10-CM | POA: Diagnosis not present

## 2018-12-03 DIAGNOSIS — M8588 Other specified disorders of bone density and structure, other site: Secondary | ICD-10-CM | POA: Diagnosis not present

## 2018-12-07 DIAGNOSIS — B0059 Other herpesviral disease of eye: Secondary | ICD-10-CM

## 2018-12-07 DIAGNOSIS — B0053 Herpesviral conjunctivitis: Secondary | ICD-10-CM | POA: Diagnosis not present

## 2018-12-07 HISTORY — DX: Other herpesviral disease of eye: B00.59

## 2018-12-15 DIAGNOSIS — N6002 Solitary cyst of left breast: Secondary | ICD-10-CM | POA: Diagnosis not present

## 2018-12-15 DIAGNOSIS — R928 Other abnormal and inconclusive findings on diagnostic imaging of breast: Secondary | ICD-10-CM | POA: Diagnosis not present

## 2018-12-24 DIAGNOSIS — H2513 Age-related nuclear cataract, bilateral: Secondary | ICD-10-CM | POA: Diagnosis not present

## 2018-12-24 DIAGNOSIS — B0053 Herpesviral conjunctivitis: Secondary | ICD-10-CM | POA: Diagnosis not present

## 2019-01-25 DIAGNOSIS — E785 Hyperlipidemia, unspecified: Secondary | ICD-10-CM | POA: Diagnosis not present

## 2019-01-25 DIAGNOSIS — I10 Essential (primary) hypertension: Secondary | ICD-10-CM | POA: Diagnosis not present

## 2019-01-25 DIAGNOSIS — R7309 Other abnormal glucose: Secondary | ICD-10-CM | POA: Diagnosis not present

## 2019-01-25 DIAGNOSIS — Z8673 Personal history of transient ischemic attack (TIA), and cerebral infarction without residual deficits: Secondary | ICD-10-CM | POA: Diagnosis not present

## 2019-04-14 DIAGNOSIS — Z1331 Encounter for screening for depression: Secondary | ICD-10-CM | POA: Diagnosis not present

## 2019-04-14 DIAGNOSIS — Z0000 Encounter for general adult medical examination without abnormal findings: Secondary | ICD-10-CM | POA: Diagnosis not present

## 2019-04-14 DIAGNOSIS — E785 Hyperlipidemia, unspecified: Secondary | ICD-10-CM | POA: Diagnosis not present

## 2019-04-14 DIAGNOSIS — Z9181 History of falling: Secondary | ICD-10-CM | POA: Diagnosis not present

## 2019-05-26 DIAGNOSIS — K210 Gastro-esophageal reflux disease with esophagitis: Secondary | ICD-10-CM | POA: Diagnosis not present

## 2019-05-26 DIAGNOSIS — I10 Essential (primary) hypertension: Secondary | ICD-10-CM | POA: Diagnosis not present

## 2019-05-26 DIAGNOSIS — E785 Hyperlipidemia, unspecified: Secondary | ICD-10-CM | POA: Diagnosis not present

## 2019-05-31 DIAGNOSIS — Z8673 Personal history of transient ischemic attack (TIA), and cerebral infarction without residual deficits: Secondary | ICD-10-CM | POA: Diagnosis not present

## 2019-05-31 DIAGNOSIS — I10 Essential (primary) hypertension: Secondary | ICD-10-CM | POA: Diagnosis not present

## 2019-05-31 DIAGNOSIS — R7309 Other abnormal glucose: Secondary | ICD-10-CM | POA: Diagnosis not present

## 2019-05-31 DIAGNOSIS — E785 Hyperlipidemia, unspecified: Secondary | ICD-10-CM | POA: Diagnosis not present

## 2019-12-05 DIAGNOSIS — E785 Hyperlipidemia, unspecified: Secondary | ICD-10-CM | POA: Diagnosis not present

## 2019-12-05 DIAGNOSIS — Z8673 Personal history of transient ischemic attack (TIA), and cerebral infarction without residual deficits: Secondary | ICD-10-CM | POA: Diagnosis not present

## 2019-12-05 DIAGNOSIS — I10 Essential (primary) hypertension: Secondary | ICD-10-CM | POA: Diagnosis not present

## 2019-12-05 DIAGNOSIS — R7309 Other abnormal glucose: Secondary | ICD-10-CM | POA: Diagnosis not present

## 2019-12-06 DIAGNOSIS — E785 Hyperlipidemia, unspecified: Secondary | ICD-10-CM | POA: Diagnosis not present

## 2019-12-06 DIAGNOSIS — R7309 Other abnormal glucose: Secondary | ICD-10-CM | POA: Diagnosis not present

## 2019-12-06 DIAGNOSIS — K2960 Other gastritis without bleeding: Secondary | ICD-10-CM | POA: Diagnosis not present

## 2019-12-06 DIAGNOSIS — D509 Iron deficiency anemia, unspecified: Secondary | ICD-10-CM | POA: Diagnosis not present

## 2020-04-18 DIAGNOSIS — Z0000 Encounter for general adult medical examination without abnormal findings: Secondary | ICD-10-CM | POA: Diagnosis not present

## 2020-04-18 DIAGNOSIS — E785 Hyperlipidemia, unspecified: Secondary | ICD-10-CM | POA: Diagnosis not present

## 2020-04-18 DIAGNOSIS — Z1331 Encounter for screening for depression: Secondary | ICD-10-CM | POA: Diagnosis not present

## 2020-04-18 DIAGNOSIS — Z9181 History of falling: Secondary | ICD-10-CM | POA: Diagnosis not present

## 2020-04-25 DIAGNOSIS — I10 Essential (primary) hypertension: Secondary | ICD-10-CM | POA: Diagnosis not present

## 2020-04-25 DIAGNOSIS — D509 Iron deficiency anemia, unspecified: Secondary | ICD-10-CM | POA: Diagnosis not present

## 2020-04-25 DIAGNOSIS — K2100 Gastro-esophageal reflux disease with esophagitis, without bleeding: Secondary | ICD-10-CM | POA: Diagnosis not present

## 2020-04-25 DIAGNOSIS — E785 Hyperlipidemia, unspecified: Secondary | ICD-10-CM | POA: Diagnosis not present

## 2020-04-25 DIAGNOSIS — N390 Urinary tract infection, site not specified: Secondary | ICD-10-CM | POA: Diagnosis not present

## 2020-05-03 DIAGNOSIS — N3281 Overactive bladder: Secondary | ICD-10-CM | POA: Diagnosis not present

## 2020-05-03 DIAGNOSIS — Z6826 Body mass index (BMI) 26.0-26.9, adult: Secondary | ICD-10-CM | POA: Diagnosis not present

## 2020-05-03 DIAGNOSIS — N390 Urinary tract infection, site not specified: Secondary | ICD-10-CM | POA: Diagnosis not present

## 2020-05-03 DIAGNOSIS — R300 Dysuria: Secondary | ICD-10-CM | POA: Diagnosis not present

## 2021-05-08 DIAGNOSIS — E785 Hyperlipidemia, unspecified: Secondary | ICD-10-CM | POA: Diagnosis not present

## 2021-05-08 DIAGNOSIS — Z0000 Encounter for general adult medical examination without abnormal findings: Secondary | ICD-10-CM | POA: Diagnosis not present

## 2021-05-08 DIAGNOSIS — Z9181 History of falling: Secondary | ICD-10-CM | POA: Diagnosis not present

## 2021-05-08 DIAGNOSIS — Z1331 Encounter for screening for depression: Secondary | ICD-10-CM | POA: Diagnosis not present

## 2021-05-23 DIAGNOSIS — U071 COVID-19: Secondary | ICD-10-CM | POA: Diagnosis not present

## 2021-06-19 DIAGNOSIS — D509 Iron deficiency anemia, unspecified: Secondary | ICD-10-CM | POA: Diagnosis not present

## 2021-06-19 DIAGNOSIS — K2100 Gastro-esophageal reflux disease with esophagitis, without bleeding: Secondary | ICD-10-CM | POA: Diagnosis not present

## 2021-06-19 DIAGNOSIS — E785 Hyperlipidemia, unspecified: Secondary | ICD-10-CM | POA: Diagnosis not present

## 2021-06-19 DIAGNOSIS — I10 Essential (primary) hypertension: Secondary | ICD-10-CM | POA: Diagnosis not present

## 2021-06-25 DIAGNOSIS — Z1231 Encounter for screening mammogram for malignant neoplasm of breast: Secondary | ICD-10-CM | POA: Diagnosis not present

## 2021-07-25 DIAGNOSIS — R928 Other abnormal and inconclusive findings on diagnostic imaging of breast: Secondary | ICD-10-CM | POA: Diagnosis not present

## 2021-07-25 DIAGNOSIS — N6002 Solitary cyst of left breast: Secondary | ICD-10-CM | POA: Diagnosis not present

## 2021-07-25 DIAGNOSIS — R921 Mammographic calcification found on diagnostic imaging of breast: Secondary | ICD-10-CM | POA: Diagnosis not present

## 2021-08-09 DIAGNOSIS — M8589 Other specified disorders of bone density and structure, multiple sites: Secondary | ICD-10-CM | POA: Diagnosis not present

## 2021-08-09 DIAGNOSIS — E2839 Other primary ovarian failure: Secondary | ICD-10-CM | POA: Diagnosis not present

## 2021-12-04 ENCOUNTER — Other Ambulatory Visit: Payer: Self-pay

## 2021-12-31 DIAGNOSIS — E785 Hyperlipidemia, unspecified: Secondary | ICD-10-CM | POA: Diagnosis not present

## 2021-12-31 DIAGNOSIS — I10 Essential (primary) hypertension: Secondary | ICD-10-CM | POA: Diagnosis not present

## 2021-12-31 DIAGNOSIS — K2100 Gastro-esophageal reflux disease with esophagitis, without bleeding: Secondary | ICD-10-CM | POA: Diagnosis not present

## 2021-12-31 DIAGNOSIS — D649 Anemia, unspecified: Secondary | ICD-10-CM | POA: Diagnosis not present

## 2021-12-31 DIAGNOSIS — E559 Vitamin D deficiency, unspecified: Secondary | ICD-10-CM | POA: Diagnosis not present

## 2022-06-05 DIAGNOSIS — R300 Dysuria: Secondary | ICD-10-CM | POA: Diagnosis not present

## 2022-06-05 DIAGNOSIS — N3090 Cystitis, unspecified without hematuria: Secondary | ICD-10-CM | POA: Diagnosis not present

## 2022-06-05 DIAGNOSIS — Z6826 Body mass index (BMI) 26.0-26.9, adult: Secondary | ICD-10-CM | POA: Diagnosis not present

## 2022-08-06 DIAGNOSIS — D509 Iron deficiency anemia, unspecified: Secondary | ICD-10-CM | POA: Diagnosis not present

## 2022-08-06 DIAGNOSIS — E785 Hyperlipidemia, unspecified: Secondary | ICD-10-CM | POA: Diagnosis not present

## 2022-08-06 DIAGNOSIS — E559 Vitamin D deficiency, unspecified: Secondary | ICD-10-CM | POA: Diagnosis not present

## 2022-08-06 DIAGNOSIS — I10 Essential (primary) hypertension: Secondary | ICD-10-CM | POA: Diagnosis not present

## 2023-02-24 DIAGNOSIS — I10 Essential (primary) hypertension: Secondary | ICD-10-CM | POA: Diagnosis not present

## 2023-02-24 DIAGNOSIS — Z6826 Body mass index (BMI) 26.0-26.9, adult: Secondary | ICD-10-CM | POA: Diagnosis not present

## 2023-02-24 DIAGNOSIS — D509 Iron deficiency anemia, unspecified: Secondary | ICD-10-CM | POA: Diagnosis not present

## 2023-02-24 DIAGNOSIS — E785 Hyperlipidemia, unspecified: Secondary | ICD-10-CM | POA: Diagnosis not present

## 2023-02-24 DIAGNOSIS — E559 Vitamin D deficiency, unspecified: Secondary | ICD-10-CM | POA: Diagnosis not present

## 2023-08-07 ENCOUNTER — Other Ambulatory Visit: Payer: Self-pay

## 2023-08-07 ENCOUNTER — Inpatient Hospital Stay (HOSPITAL_COMMUNITY)
Admission: EM | Admit: 2023-08-07 | Discharge: 2023-08-10 | DRG: 282 | Disposition: A | Discharge status: Home / Self Care | Payer: Medicare Other | Attending: Family Medicine | Admitting: Family Medicine

## 2023-08-07 ENCOUNTER — Encounter (HOSPITAL_COMMUNITY): Payer: Self-pay

## 2023-08-07 ENCOUNTER — Emergency Department (HOSPITAL_COMMUNITY): Payer: Medicare Other

## 2023-08-07 DIAGNOSIS — Z79899 Other long term (current) drug therapy: Secondary | ICD-10-CM

## 2023-08-07 DIAGNOSIS — Z8673 Personal history of transient ischemic attack (TIA), and cerebral infarction without residual deficits: Secondary | ICD-10-CM | POA: Diagnosis not present

## 2023-08-07 DIAGNOSIS — I2510 Atherosclerotic heart disease of native coronary artery without angina pectoris: Secondary | ICD-10-CM | POA: Diagnosis present

## 2023-08-07 DIAGNOSIS — R9431 Abnormal electrocardiogram [ECG] [EKG]: Secondary | ICD-10-CM | POA: Insufficient documentation

## 2023-08-07 DIAGNOSIS — I25110 Atherosclerotic heart disease of native coronary artery with unstable angina pectoris: Secondary | ICD-10-CM | POA: Diagnosis not present

## 2023-08-07 DIAGNOSIS — Z882 Allergy status to sulfonamides status: Secondary | ICD-10-CM | POA: Diagnosis not present

## 2023-08-07 DIAGNOSIS — R32 Unspecified urinary incontinence: Secondary | ICD-10-CM | POA: Diagnosis not present

## 2023-08-07 DIAGNOSIS — Z825 Family history of asthma and other chronic lower respiratory diseases: Secondary | ICD-10-CM

## 2023-08-07 DIAGNOSIS — R079 Chest pain, unspecified: Principal | ICD-10-CM

## 2023-08-07 DIAGNOSIS — Z7902 Long term (current) use of antithrombotics/antiplatelets: Secondary | ICD-10-CM

## 2023-08-07 DIAGNOSIS — E785 Hyperlipidemia, unspecified: Secondary | ICD-10-CM | POA: Diagnosis present

## 2023-08-07 DIAGNOSIS — Z801 Family history of malignant neoplasm of trachea, bronchus and lung: Secondary | ICD-10-CM | POA: Diagnosis not present

## 2023-08-07 DIAGNOSIS — M810 Age-related osteoporosis without current pathological fracture: Secondary | ICD-10-CM | POA: Diagnosis not present

## 2023-08-07 DIAGNOSIS — Z955 Presence of coronary angioplasty implant and graft: Secondary | ICD-10-CM

## 2023-08-07 DIAGNOSIS — I25118 Atherosclerotic heart disease of native coronary artery with other forms of angina pectoris: Secondary | ICD-10-CM | POA: Diagnosis not present

## 2023-08-07 DIAGNOSIS — I249 Acute ischemic heart disease, unspecified: Secondary | ICD-10-CM

## 2023-08-07 DIAGNOSIS — Z961 Presence of intraocular lens: Secondary | ICD-10-CM | POA: Diagnosis present

## 2023-08-07 DIAGNOSIS — I214 Non-ST elevation (NSTEMI) myocardial infarction: Secondary | ICD-10-CM | POA: Diagnosis not present

## 2023-08-07 DIAGNOSIS — N3281 Overactive bladder: Secondary | ICD-10-CM | POA: Diagnosis not present

## 2023-08-07 DIAGNOSIS — Z7982 Long term (current) use of aspirin: Secondary | ICD-10-CM

## 2023-08-07 DIAGNOSIS — I10 Essential (primary) hypertension: Secondary | ICD-10-CM | POA: Diagnosis not present

## 2023-08-07 DIAGNOSIS — R0789 Other chest pain: Secondary | ICD-10-CM | POA: Diagnosis not present

## 2023-08-07 DIAGNOSIS — Z885 Allergy status to narcotic agent status: Secondary | ICD-10-CM

## 2023-08-07 DIAGNOSIS — I700 Atherosclerosis of aorta: Secondary | ICD-10-CM | POA: Diagnosis not present

## 2023-08-07 DIAGNOSIS — K219 Gastro-esophageal reflux disease without esophagitis: Secondary | ICD-10-CM | POA: Diagnosis present

## 2023-08-07 DIAGNOSIS — Z888 Allergy status to other drugs, medicaments and biological substances status: Secondary | ICD-10-CM | POA: Diagnosis not present

## 2023-08-07 DIAGNOSIS — E782 Mixed hyperlipidemia: Secondary | ICD-10-CM | POA: Diagnosis not present

## 2023-08-07 HISTORY — DX: Non-ST elevation (NSTEMI) myocardial infarction: I21.4

## 2023-08-07 LAB — BASIC METABOLIC PANEL
Anion gap: 13 (ref 5–15)
BUN: 8 mg/dL (ref 8–23)
CO2: 24 mmol/L (ref 22–32)
Calcium: 9.2 mg/dL (ref 8.9–10.3)
Chloride: 104 mmol/L (ref 98–111)
Creatinine, Ser: 0.78 mg/dL (ref 0.44–1.00)
GFR, Estimated: >60 mL/min (ref >60)
Glucose, Bld: 100 mg/dL — ABNORMAL HIGH (ref 70–99)
Potassium: 4.1 mmol/L (ref 3.5–5.1)
Sodium: 141 mmol/L (ref 135–145)

## 2023-08-07 LAB — CBC
HCT: 38.8 % (ref 36.0–46.0)
Hemoglobin: 13.5 g/dL (ref 12.0–15.0)
MCH: 31 pg (ref 26.0–34.0)
MCHC: 34.8 g/dL (ref 30.0–36.0)
MCV: 89 fL (ref 80.0–100.0)
Platelets: 218 10*3/uL (ref 150–400)
RBC: 4.36 MIL/uL (ref 3.87–5.11)
RDW: 13 % (ref 11.5–15.5)
WBC: 7.1 10*3/uL (ref 4.0–10.5)
nRBC: 0 % (ref 0.0–0.2)

## 2023-08-07 LAB — TROPONIN I (HIGH SENSITIVITY)
Troponin I (High Sensitivity): 866 ng/L (ref <18)
Troponin I (High Sensitivity): 715 ng/L (ref <18)

## 2023-08-07 MED ORDER — HEPARIN BOLUS VIA INFUSION
4000.0000 [IU] | Freq: Once | INTRAVENOUS | Status: AC
  Administered 2023-08-07: 4000 [IU] via INTRAVENOUS
  Filled 2023-08-07: qty 4000

## 2023-08-07 MED ORDER — HEPARIN (PORCINE) 25000 UT/250ML-% IV SOLN
1000.0000 [IU]/h | INTRAVENOUS | Status: DC
  Administered 2023-08-07: 850 [IU]/h via INTRAVENOUS
  Administered 2023-08-09 (×2): 1000 [IU]/h via INTRAVENOUS
  Filled 2023-08-07 (×3): qty 250

## 2023-08-07 NOTE — ED Triage Notes (Signed)
Lab has called a 866 trop  acuity increased  charge  notofied

## 2023-08-07 NOTE — ED Triage Notes (Signed)
Pt arrived POV from home c/o left sided CP that started last night that has been intermittent but radiates to her left arm. Pt has also had some dizziness and lightheadedness but denies SHOB, N/V or back pain.

## 2023-08-07 NOTE — ED Notes (Signed)
Dr. Arrived at bedside

## 2023-08-07 NOTE — ED Provider Notes (Signed)
[place] EMERGENCY DEPARTMENT AT [facility] Provider Note   CSN: [id number] Arrival date & time: 08/07/23  1954     History  Chief Complaint  Patient presents with   Chest Pain   HPI Kristen Maynard is a 77 y.o. female with CAD s/p POBA in 1999, mitral valve prolapse, history of TIA, hypertension hyperlipidemia presenting for chest pain.  States she had some chest pain last night but went away disproportionate to sleep.  Today around 5 PM she was moving boxes around the house when she felt pain in the left chest.  It was a dull like pain and radiated down the left arm.  Had some shortness of breath with it as well.  Denies diaphoresis nausea or vomiting.  States she was dizzy at that time as well.  Endorses a loss of balance sensation.  Symptoms resolved after 2 hours.  Now she is chest pain-free and not dizzy.   Chest Pain      Home Medications Prior to Admission medications   Medication Sig Start Date End Date Taking? Authorizing Provider  acetaminophen (TYLENOL) 325 MG tablet Take 650 mg by mouth every 6 (six) hours as needed for moderate pain.   Yes [provider]  aspirin 81 MG chewable tablet Chew 1 tablet (81 mg total) by mouth daily. 07/04/15  Yes Vann, Jessica U, DO  atenolol (TENORMIN) 50 MG tablet Take 50 mg by mouth daily.     Yes [provider]  Calcium-Magnesium-Vitamin D (CALCIUM 1200+D3 PO) Take 1 capsule by mouth in the morning and at bedtime.   Yes [provider]  clopidogrel (PLAVIX) 75 MG tablet Take 75 mg by mouth daily.     Yes [provider]  CRANBERRY PO Take 1,000 mg by mouth daily.   Yes [provider]  ferrous sulfate 325 (65 FE) MG tablet Take 325 mg by mouth daily with breakfast.   Yes [provider]  loratadine (CLARITIN) 10 MG tablet Take 10 mg by mouth daily.   Yes [provider]  losartan (COZAAR) 100 MG tablet Take 100 mg by mouth daily.   Yes [provider]   Multiple Vitamin (MULTIVITAMIN WITH MINERALS) TABS tablet Take 1 tablet by mouth daily.   Yes [provider]  omeprazole (PRILOSEC) 40 MG capsule Take 40 mg by mouth daily.   Yes [provider]  simvastatin (ZOCOR) 40 MG tablet Take 40 mg by mouth daily at 6 PM.   Yes [provider]  solifenacin (VESICARE) 5 MG tablet Take 5 mg by mouth daily. 06/24/23  Yes [provider]      Allergies    Aggrenox [aspirin-dipyridamole er], Codeine, and Sulfa antibiotics    Review of Systems   Review of Systems  Cardiovascular:  Positive for chest pain.    Physical Exam   Vitals:   08/07/23 2215 08/07/23 2315  BP: (!) 165/70 (!) 165/69  Pulse: 78 82  Resp: 15 20  Temp:    SpO2: 99% 97%    CONSTITUTIONAL:  well-appearing, NAD NEURO:  Alert and oriented x 3, CN 3-12 grossly intact EYES:  eyes equal and reactive ENT/NECK:  Supple, no stridor  CARDIO:  Regular rate and rhythm, appears well-perfused  PULM:  No respiratory distress, CTAB GI/GU:  non-distended, soft MSK/SPINE:  No gross deformities, no edema, moves all extremities  SKIN:  no rash, atraumatic  *Additional and/or pertinent findings included in MDM below  ED Results / Procedures /  Treatments   Labs (all labs ordered are listed, but only abnormal results are displayed) Labs Reviewed  BASIC METABOLIC PANEL - Abnormal; Notable for the following components:      Result Value   Glucose, Bld 100 (*)    All other components within normal limits  TROPONIN I (HIGH SENSITIVITY) - Abnormal; Notable for the following components:   Troponin I (High Sensitivity) 866 (*)    All other components within normal limits  TROPONIN I (HIGH SENSITIVITY) - Abnormal; Notable for the following components:   Troponin I (High Sensitivity) 715 (*)    All other components within normal limits  CBC  HEPARIN LEVEL (UNFRACTIONATED)  CBC  TROPONIN I (HIGH SENSITIVITY)    EKG EKG  Interpretation Date/Time:  Friday August 07 2023 20:03:38 EDT Ventricular Rate:  85 PR Interval:  170 QRS Duration:  72 QT Interval:  414 QTC Calculation: 492 R Axis:   30  Text Interpretation: Normal sinus rhythm Prolonged QT Abnormal ECG Confirmed by Alvino Blood ([id number]) on 08/07/2023 10:13:33 PM  Radiology DG Chest 2 View  Result Date: 08/07/2023 CLINICAL DATA:  Chest pain. EXAM: CHEST - 2 VIEW COMPARISON:  05/14/2018. FINDINGS: The heart size and mediastinal contours are within normal limits. There is atherosclerotic calcification of the aorta. No consolidation, effusion, or pneumothorax. Degenerative changes are present in the thoracic spine. No acute osseous abnormality. IMPRESSION: No active cardiopulmonary disease. Electronically Signed   By: Thornell Sartorius M.D.   On: 08/07/2023 22:34    Procedures .Critical Care  Performed by: Gareth Eagle, PA-C Authorized by: Gareth Eagle, PA-C   Critical care provider statement:    Critical care time (minutes):  30   Critical care was necessary to treat or prevent imminent or life-threatening deterioration of the following conditions: concern for nstemi. started iv heparin.   Critical care was time spent personally by me on the following activities:  Development of treatment plan with patient or surrogate, discussions with consultants, evaluation of patient's response to treatment, examination of patient, ordering and review of laboratory studies, ordering and review of radiographic studies, ordering and performing treatments and interventions, pulse oximetry, re-evaluation of patient's condition and review of old charts     Medications Ordered in ED Medications  heparin ADULT infusion 100 units/mL (25000 units/243mL) (850 Units/hr Intravenous New Bag/Given 08/07/23 2301)  heparin bolus via infusion 4,000 Units (4,000 Units Intravenous Bolus from Bag 08/07/23 2301)    ED Course/ Medical Decision Making/ A&P Clinical Course  as of 08/07/23 2339  Fri Aug 07, 2023  2223 Per cards (wilmot), trend EKG. Start heparin. Admit to hospital.  N.p.o. at midnight.  Dr. Aron Baba also plans to come by and see patient. [JR]    Clinical Course User Index [JR] Gareth Eagle, PA-C                                 Medical Decision Making Amount and/or Complexity of Data Reviewed Labs: ordered. Radiology: ordered.  Risk Prescription drug management. Decision regarding hospitalization.   Initial Impression and Ddx 77 year old well-appearing female present for chest pain.  Exam was unremarkable.  DDx includes ACS, PE, pneumonia, pneumothorax, and aortic dissection. Patient PMH that increases complexity of ED encounter:  CAD  Interpretation of Diagnostics - I independent reviewed and interpreted the labs as followed: elevated troponin  - I independently visualized the following imaging with scope of interpretation limited to determining acute  life threatening conditions related to emergency care: cxr, which revealed no acute findings  - I personally reviewed and interpreted EKG which revealed NSR Patient Reassessment and Ultimate Disposition/Management On reassessment, patient remained chest pain-free.  However history of exertional chest pain is concerning given her risk factors.  Reached out to cardiology.  Dr. Laren Everts advised to admit to the hospital and trend EKG and troponins along with starting heparin.  Advised to make n.p.o. at midnight.  Echo in the morning.  Cards will see in consult in the morning as well.  Admitted to hospital service with Dr. Loney Loh.  Patient management required discussion with the following services or consulting groups:  Hospitalist Service and Cardiology  Complexity of Problems Addressed Acute complicated illness or Injury  Additional Data Reviewed and Analyzed Further history obtained from: Further history from spouse/family member, Past medical history and medications listed in the  EMR, and Prior ED visit notes  Patient Encounter Risk Assessment Consideration of hospitalization         Final Clinical Impression(s) / ED Diagnoses Final diagnoses:  Chest pain, unspecified type    Rx / DC Orders ED Discharge Orders     None         Gareth Eagle, PA-C 08/07/23 2339    Lonell Grandchild, MD 08/08/23 1257

## 2023-08-07 NOTE — Consult Note (Signed)
Cardiology Consultation:   Patient ID: Kristen Maynard MRN: [id number]; DOB: [birth year]  Admit date: 08/07/2023 Date of Consult: 08/07/2023  Primary Care Provider: Paulina Fusi, MD Primary Cardiologist: None  Primary Electrophysiologist:  None    Patient Profile:   Kristen Maynard is a 77 y.o. female with a hx of coronary disease status post PCI in 1999, primary hypertension, dyslipidemia, history of CVA who is being seen today for the evaluation of Chest pain at the request of ED.  History of Present Illness:   Kristen Maynard she is a pleasant 77 year old female with a history of prior PCI almost 25 years ago and short-term memory from a prior stroke who presents with recurrent chest discomfort.  She reports with assistance of her daughter that should she presents because of significant chest discomfort which is radiating to her left arm and down and persisted for approximately an hour.  Daughter found out that she had a similar episode yesterday evening which also resolved spontaneously.  She has had acute evaluation back in 2016 but nothing since.  Functioning has been doing well and able to get around.  Past Medical History:  Diagnosis Date   CAD (coronary artery disease) 1999   status post angioplasty w/out stent placement in 1999 done by Dr. Juanda Chance. According to pt, a stent was not placed due to small size vessel. No cardiac events since then.   GERD (gastroesophageal reflux disease)    HLD (hyperlipidemia)    HTN (hypertension)    Mitral valve prolapse    Osteoporosis    TIA (transient ischemic attack)     Past Surgical History:  Procedure Laterality Date   CARDIAC CATHETERIZATION       Home Medications:  Prior to Admission medications   Medication Sig Start Date End Date Taking? Authorizing Provider  acetaminophen (TYLENOL) 325 MG tablet Take 650 mg by mouth every 6 (six) hours as needed for moderate pain.   Yes [provider]  aspirin 81 MG chewable  tablet Chew 1 tablet (81 mg total) by mouth daily. 07/04/15  Yes Vann, Jessica U, DO  atenolol (TENORMIN) 50 MG tablet Take 50 mg by mouth daily.     Yes [provider]  Calcium-Magnesium-Vitamin D (CALCIUM 1200+D3 PO) Take 1 capsule by mouth in the morning and at bedtime.   Yes [provider]  clopidogrel (PLAVIX) 75 MG tablet Take 75 mg by mouth daily.     Yes [provider]  CRANBERRY PO Take 1,000 mg by mouth daily.   Yes [provider]  ferrous sulfate 325 (65 FE) MG tablet Take 325 mg by mouth daily with breakfast.   Yes [provider]  loratadine (CLARITIN) 10 MG tablet Take 10 mg by mouth daily.   Yes [provider]  losartan (COZAAR) 100 MG tablet Take 100 mg by mouth daily.   Yes [provider]  Multiple Vitamin (MULTIVITAMIN WITH MINERALS) TABS tablet Take 1 tablet by mouth daily.   Yes [provider]  omeprazole (PRILOSEC) 40 MG capsule Take 40 mg by mouth daily.   Yes [provider]  simvastatin (ZOCOR) 40 MG tablet Take 40 mg by mouth daily at 6 PM.   Yes [provider]  solifenacin (VESICARE) 5 MG tablet Take 5 mg by mouth daily. 06/24/23  Yes [provider]    Inpatient Medications: Scheduled Meds:  Continuous Infusions:  PRN Meds:   Allergies:    Allergies  Allergen Reactions   Aggrenox [  Aspirin-Dipyridamole Er] Other (See Comments)    Headaches maybe? Told never to take med   Codeine Other (See Comments)    Confusion   Sulfa Antibiotics Other (See Comments)    Seizures     Social History:   Social History   Socioeconomic History   Marital status: Married    Spouse name: Not on file   Number of children: Not on file   Years of education: Not on file   Highest education level: Not on file  Occupational History   Not on file  Tobacco Use   Smoking status: Never   Smokeless tobacco: Not on file  Substance and Sexual Activity   Alcohol use: No    Drug use: No   Sexual activity: Not on file  Other Topics Concern   Not on file  Social History Narrative   Retired.    Social Determinants of Health   Financial Resource Strain: Not on file  Food Insecurity: Not on file  Transportation Needs: Not on file  Physical Activity: Not on file  Stress: Not on file  Social Connections: Not on file  Intimate Partner Violence: Not on file    Family History:    Family History  Problem Relation Age of Onset   Lung cancer Mother    COPD Father       Review of Systems: [y] = yes, [ ]  = no   General: Weight gain [ ] ; Weight loss [ ] ; Anorexia [ ] ; Fatigue [ ] ; Fever [ ] ; Chills [ ] ; Weakness [ ]   Cardiac: Chest pain/pressure [ ] ; Resting SOB [ ] ; Exertional SOB [ ] ; Orthopnea [ ] ; Pedal Edema [ ] ; Palpitations [ ] ; Syncope [ ] ; Presyncope [ ] ; Paroxysmal nocturnal dyspnea[ ]   Pulmonary: Cough [ ] ; Wheezing[ ] ; Hemoptysis[ ] ; Sputum [ ] ; Snoring [ ]   GI: Vomiting[ ] ; Dysphagia[ ] ; Melena[ ] ; Hematochezia [ ] ; Heartburn[ ] ; Abdominal pain [ ] ; Constipation [ ] ; Diarrhea [ ] ; BRBPR [ ]   GU: Hematuria[ ] ; Dysuria [ ] ; Nocturia[ ]   Vascular: Pain in legs with walking [ ] ; Pain in feet with lying flat [ ] ; Non-healing sores [ ] ; Stroke [ ] ; TIA [ ] ; Slurred speech [ ] ;  Neuro: Headaches[ ] ; Vertigo[ ] ; Seizures[ ] ; Paresthesias[ ] ;Blurred vision [ ] ; Diplopia [ ] ; Vision changes [ ]   Ortho/Skin: Arthritis [ ] ; Joint pain [ ] ; Muscle pain [ ] ; Joint swelling [ ] ; Back Pain [ ] ; Rash [ ]   Psych: Depression[ ] ; Anxiety[ ]   Heme: Bleeding problems [ ] ; Clotting disorders [ ] ; Anemia [ ]   Endocrine: Diabetes [ ] ; Thyroid dysfunction[ ]   Physical Exam/Data:   Vitals:   08/07/23 2003 08/07/23 2119 08/07/23 2200 08/07/23 2215  BP:  (!) 148/73  (!) 165/70  Pulse:  82 80 78  Resp:  18 19 15   Temp:  97.7 F (36.5 C)    SpO2:  100% 98% 99%  Weight: 74.8 kg     Height: 5\' 6"  (1.676 m)      No intake or output data in the 24 hours ending 08/07/23  2228 Filed Weights   08/07/23 2003  Weight: 74.8 kg   Body mass index is 26.63 kg/m.  General:  Well nourished, well developed, in no acute distress HEENT: normal Lymph: no adenopathy Neck: no JVD Endocrine:  No thryomegaly Vascular: No carotid bruits; FA pulses 2+ bilaterally without bruits  Cardiac:  normal S1, S2; RRR; no murmur  Lungs:  clear  to auscultation bilaterally, no wheezing, rhonchi or rales  Abd: soft, nontender, no hepatomegaly  Ext: no edema Musculoskeletal:  No deformities, BUE and BLE strength normal and equal Skin: warm and dry  Neuro:  CNs 2-12 intact, no focal abnormalities noted Psych:  Normal affect   EKG:  The EKG was personally reviewed and demonstrates: Normal sinus rhythm no ST elevation   Relevant CV Studies: Echo 2016: Left ventricle: The cavity size was normal. Wall thickness was    normal. Systolic function was vigorous. The estimated ejection    fraction was in the range of 65% to 70%. Wall motion was normal;    there were no regional wall motion abnormalities.   Laboratory Data:  Chemistry Recent Labs  Lab 08/07/23 2012  NA 141  K 4.1  CL 104  CO2 24  GLUCOSE 100*  BUN 8  CREATININE 0.78  CALCIUM 9.2  GFRNONAA >60  ANIONGAP 13    No results for input(s): "PROT", "ALBUMIN", "AST", "ALT", "ALKPHOS", "BILITOT" in the last 168 hours. Hematology Recent Labs  Lab 08/07/23 2012  WBC 7.1  RBC 4.36  HGB 13.5  HCT 38.8  MCV 89.0  MCH 31.0  MCHC 34.8  RDW 13.0  PLT 218   Cardiac EnzymesNo results for input(s): "TROPONINI" in the last 168 hours. No results for input(s): "TROPIPOC" in the last 168 hours.  BNPNo results for input(s): "BNP", "PROBNP" in the last 168 hours.  DDimer No results for input(s): "DDIMER" in the last 168 hours.  Radiology/Studies:  No results found.  Assessment and Plan:   Acute coronary syndrome with Non-ST elevation myocardial infarction Stuttering symptoms with troponin up to 800 Coronary artery  disease status post PCI in 1990s History of CVA   Plan: -Continue beta-blockade and aspirin along with heparin drip.  Patient may have missed some of these medicines in the last couple days because of the recent move coming on. -Nitro for any additional chest pain -Echocardiogram -Will consider for left heart catheterization likely Monday.  For questions or updates, please contact [facility] Please consult [email or web address] for contact info under     Signed, Macie Burows, MD  08/07/2023 10:28 PM

## 2023-08-07 NOTE — Progress Notes (Signed)
PHARMACY - ANTICOAGULATION CONSULT NOTE  Pharmacy Consult for IV heparin Indication: chest pain/ACS  Allergies  Allergen Reactions   Aggrenox [Aspirin-Dipyridamole Er] Other (See Comments)    Headaches maybe? Told never to take med   Codeine Other (See Comments)    Confusion   Sulfa Antibiotics Other (See Comments)    Seizures     Patient Measurements: Height: 5\' 6"  (167.6 cm) Weight: 74.8 kg (165 lb) IBW/kg (Calculated) : 59.3 Heparin Dosing Weight: 74.3 kg  Vital Signs: Temp: 97.7 F (36.5 C) (10/11 2119) BP: 165/70 (10/11 2215) Pulse Rate: 78 (10/11 2215)  Labs: Recent Labs    08/07/23 2012  HGB 13.5  HCT 38.8  PLT 218  CREATININE 0.78  TROPONINIHS 866*    Estimated Creatinine Clearance: 59 mL/min (by C-G formula based on SCr of 0.78 mg/dL).   Medical History: Past Medical History:  Diagnosis Date   CAD (coronary artery disease) 1999   status post angioplasty w/out stent placement in 1999 done by Dr. Juanda Chance. According to pt, a stent was not placed due to small size vessel. No cardiac events since then.   GERD (gastroesophageal reflux disease)    HLD (hyperlipidemia)    HTN (hypertension)    Mitral valve prolapse    Osteoporosis    TIA (transient ischemic attack)    Assessment: Kristen Maynard is a 77 y.o. year old female presents with chest pain on 08/07/2023 with concern for ACS. Troponin 866 in ED. No anticoagulation prior to admission. Pharmacy consulted to dose heparin.  Goal of Therapy:  Heparin level 0.3-0.7 units/ml Monitor platelets by anticoagulation protocol: Yes   Plan:  Heparin 4000 units x 1 as bolus followed by heparin infusion at 850 units/hr 8 heparin level  Daily heparin level, CBC, and monitoring for bleeding F/u plans for anticoagulation and cath   Thank you for allowing pharmacy to participate in this patient's care.  Marja Kays, PharmD Emergency Medicine Clinical Pharmacist 08/07/2023,10:32 PM

## 2023-08-08 ENCOUNTER — Observation Stay (HOSPITAL_COMMUNITY): Payer: Medicare Other

## 2023-08-08 DIAGNOSIS — Z8673 Personal history of transient ischemic attack (TIA), and cerebral infarction without residual deficits: Secondary | ICD-10-CM | POA: Diagnosis not present

## 2023-08-08 DIAGNOSIS — I25118 Atherosclerotic heart disease of native coronary artery with other forms of angina pectoris: Secondary | ICD-10-CM | POA: Diagnosis not present

## 2023-08-08 DIAGNOSIS — Z888 Allergy status to other drugs, medicaments and biological substances status: Secondary | ICD-10-CM | POA: Diagnosis not present

## 2023-08-08 DIAGNOSIS — Z7982 Long term (current) use of aspirin: Secondary | ICD-10-CM | POA: Diagnosis not present

## 2023-08-08 DIAGNOSIS — N3281 Overactive bladder: Secondary | ICD-10-CM | POA: Diagnosis present

## 2023-08-08 DIAGNOSIS — R9431 Abnormal electrocardiogram [ECG] [EKG]: Secondary | ICD-10-CM | POA: Insufficient documentation

## 2023-08-08 DIAGNOSIS — I214 Non-ST elevation (NSTEMI) myocardial infarction: Secondary | ICD-10-CM

## 2023-08-08 DIAGNOSIS — E782 Mixed hyperlipidemia: Secondary | ICD-10-CM | POA: Diagnosis not present

## 2023-08-08 DIAGNOSIS — E785 Hyperlipidemia, unspecified: Secondary | ICD-10-CM | POA: Diagnosis present

## 2023-08-08 DIAGNOSIS — R32 Unspecified urinary incontinence: Secondary | ICD-10-CM | POA: Diagnosis present

## 2023-08-08 DIAGNOSIS — I2510 Atherosclerotic heart disease of native coronary artery without angina pectoris: Secondary | ICD-10-CM | POA: Diagnosis present

## 2023-08-08 DIAGNOSIS — Z882 Allergy status to sulfonamides status: Secondary | ICD-10-CM | POA: Diagnosis not present

## 2023-08-08 DIAGNOSIS — Z825 Family history of asthma and other chronic lower respiratory diseases: Secondary | ICD-10-CM | POA: Diagnosis not present

## 2023-08-08 DIAGNOSIS — Z801 Family history of malignant neoplasm of trachea, bronchus and lung: Secondary | ICD-10-CM | POA: Diagnosis not present

## 2023-08-08 DIAGNOSIS — Z79899 Other long term (current) drug therapy: Secondary | ICD-10-CM | POA: Diagnosis not present

## 2023-08-08 DIAGNOSIS — I25110 Atherosclerotic heart disease of native coronary artery with unstable angina pectoris: Secondary | ICD-10-CM | POA: Diagnosis not present

## 2023-08-08 DIAGNOSIS — Z885 Allergy status to narcotic agent status: Secondary | ICD-10-CM | POA: Diagnosis not present

## 2023-08-08 DIAGNOSIS — K219 Gastro-esophageal reflux disease without esophagitis: Secondary | ICD-10-CM | POA: Diagnosis present

## 2023-08-08 DIAGNOSIS — I10 Essential (primary) hypertension: Secondary | ICD-10-CM | POA: Diagnosis present

## 2023-08-08 DIAGNOSIS — I249 Acute ischemic heart disease, unspecified: Secondary | ICD-10-CM | POA: Diagnosis not present

## 2023-08-08 DIAGNOSIS — Z7902 Long term (current) use of antithrombotics/antiplatelets: Secondary | ICD-10-CM | POA: Diagnosis not present

## 2023-08-08 DIAGNOSIS — Z955 Presence of coronary angioplasty implant and graft: Secondary | ICD-10-CM | POA: Diagnosis not present

## 2023-08-08 DIAGNOSIS — M810 Age-related osteoporosis without current pathological fracture: Secondary | ICD-10-CM | POA: Diagnosis present

## 2023-08-08 DIAGNOSIS — Z961 Presence of intraocular lens: Secondary | ICD-10-CM | POA: Diagnosis present

## 2023-08-08 HISTORY — DX: Abnormal electrocardiogram (ECG) (EKG): R94.31

## 2023-08-08 LAB — ECHOCARDIOGRAM COMPLETE
AR max vel: 2.98 cm2
AV Area VTI: 2.79 cm2
AV Area mean vel: 2.88 cm2
AV Mean grad: 4 mm[Hg]
AV Peak grad: 7.2 mm[Hg]
Ao pk vel: 1.34 m/s
Area-P 1/2: 3.19 cm2
Height: 66 in
S' Lateral: 2.5 cm
Weight: 2640 [oz_av]

## 2023-08-08 LAB — HEPARIN LEVEL (UNFRACTIONATED)
Heparin Unfractionated: 0.25 [IU]/mL — ABNORMAL LOW (ref 0.30–0.70)
Heparin Unfractionated: 0.43 [IU]/mL (ref 0.30–0.70)
Heparin Unfractionated: 0.52 [IU]/mL (ref 0.30–0.70)

## 2023-08-08 LAB — CBC
HCT: 34.6 % — ABNORMAL LOW (ref 36.0–46.0)
Hemoglobin: 12 g/dL (ref 12.0–15.0)
MCH: 30.2 pg (ref 26.0–34.0)
MCHC: 34.7 g/dL (ref 30.0–36.0)
MCV: 86.9 fL (ref 80.0–100.0)
Platelets: 178 10*3/uL (ref 150–400)
RBC: 3.98 MIL/uL (ref 3.87–5.11)
RDW: 13.1 % (ref 11.5–15.5)
WBC: 6.8 10*3/uL (ref 4.0–10.5)
nRBC: 0 % (ref 0.0–0.2)

## 2023-08-08 LAB — MAGNESIUM: Magnesium: 2 mg/dL (ref 1.7–2.4)

## 2023-08-08 LAB — TROPONIN I (HIGH SENSITIVITY): Troponin I (High Sensitivity): 745 ng/L (ref <18)

## 2023-08-08 MED ORDER — ATENOLOL 25 MG PO TABS
50.0000 mg | ORAL_TABLET | Freq: Every day | ORAL | Status: DC
  Administered 2023-08-08 – 2023-08-10 (×3): 50 mg via ORAL
  Filled 2023-08-08 (×3): qty 2

## 2023-08-08 MED ORDER — ACETAMINOPHEN 325 MG PO TABS: 650.0000 mg | ORAL_TABLET | Freq: Four times a day (QID) | ORAL | Status: DC | PRN

## 2023-08-08 MED ORDER — SODIUM CHLORIDE 0.9 % WEIGHT BASED INFUSION
3.0000 mL/kg/h | INTRAVENOUS | Status: DC
  Administered 2023-08-10: 3 mL/kg/h via INTRAVENOUS

## 2023-08-08 MED ORDER — FESOTERODINE FUMARATE ER 4 MG PO TB24
4.0000 mg | ORAL_TABLET | Freq: Every day | ORAL | Status: DC
  Administered 2023-08-08 – 2023-08-10 (×3): 4 mg via ORAL
  Filled 2023-08-08 (×3): qty 1

## 2023-08-08 MED ORDER — ASPIRIN 81 MG PO CHEW
81.0000 mg | CHEWABLE_TABLET | Freq: Every day | ORAL | Status: DC
  Administered 2023-08-08 – 2023-08-09 (×2): 81 mg via ORAL
  Filled 2023-08-08 (×3): qty 1

## 2023-08-08 MED ORDER — NITROGLYCERIN 0.4 MG SL SUBL: 0.4000 mg | SUBLINGUAL_TABLET | SUBLINGUAL | Status: DC | PRN

## 2023-08-08 MED ORDER — LORATADINE 10 MG PO TABS
10.0000 mg | ORAL_TABLET | Freq: Every day | ORAL | Status: DC
  Administered 2023-08-08 – 2023-08-10 (×3): 10 mg via ORAL
  Filled 2023-08-08 (×3): qty 1

## 2023-08-08 MED ORDER — LOSARTAN POTASSIUM 50 MG PO TABS
100.0000 mg | ORAL_TABLET | Freq: Every day | ORAL | Status: DC
  Administered 2023-08-08 – 2023-08-10 (×3): 100 mg via ORAL
  Filled 2023-08-08 (×3): qty 2

## 2023-08-08 MED ORDER — PERFLUTREN LIPID MICROSPHERE
1.0000 mL | INTRAVENOUS | Status: AC | PRN
  Administered 2023-08-08: 2 mL via INTRAVENOUS

## 2023-08-08 MED ORDER — ACETAMINOPHEN 650 MG RE SUPP: 650.0000 mg | Freq: Four times a day (QID) | RECTAL | Status: DC | PRN

## 2023-08-08 MED ORDER — SIMVASTATIN 20 MG PO TABS
40.0000 mg | ORAL_TABLET | Freq: Every day | ORAL | Status: DC
  Administered 2023-08-08 – 2023-08-09 (×2): 40 mg via ORAL
  Filled 2023-08-08 (×2): qty 2

## 2023-08-08 MED ORDER — PANTOPRAZOLE SODIUM 40 MG PO TBEC
40.0000 mg | DELAYED_RELEASE_TABLET | Freq: Every day | ORAL | Status: DC
  Administered 2023-08-08 – 2023-08-10 (×3): 40 mg via ORAL
  Filled 2023-08-08 (×3): qty 1

## 2023-08-08 MED ORDER — CLOPIDOGREL BISULFATE 75 MG PO TABS
75.0000 mg | ORAL_TABLET | Freq: Every day | ORAL | Status: DC
  Administered 2023-08-08 – 2023-08-10 (×3): 75 mg via ORAL
  Filled 2023-08-08 (×3): qty 1

## 2023-08-08 MED ORDER — SODIUM CHLORIDE 0.9 % WEIGHT BASED INFUSION
1.0000 mL/kg/h | INTRAVENOUS | Status: DC
  Administered 2023-08-10: 1 mL/kg/h via INTRAVENOUS

## 2023-08-08 MED ORDER — ASPIRIN 81 MG PO CHEW
81.0000 mg | CHEWABLE_TABLET | ORAL | Status: AC
  Administered 2023-08-10: 81 mg via ORAL
  Filled 2023-08-08: qty 1

## 2023-08-08 NOTE — Progress Notes (Signed)
PHARMACY - ANTICOAGULATION CONSULT NOTE  Pharmacy Consult for IV heparin Indication: chest pain/ACS  Allergies  Allergen Reactions   Aggrenox [Aspirin-Dipyridamole Er] Other (See Comments)    Headaches maybe? Told never to take med   Codeine Other (See Comments)    Confusion   Sulfa Antibiotics Other (See Comments)    Seizures     Patient Measurements: Height: 5\' 6"  (167.6 cm) Weight: 74.8 kg (165 lb) IBW/kg (Calculated) : 59.3 Heparin Dosing Weight: 74.3 kg  Vital Signs: Temp: 97.4 F (36.3 C) (10/12 1337) Temp Source: Oral (10/12 1337) BP: 134/65 (10/12 1335) Pulse Rate: 76 (10/12 1337)  Labs: Recent Labs    08/07/23 2012 08/07/23 2322 08/08/23 0046 08/08/23 0538 08/08/23 1350  HGB 13.5  --  12.0  --   --   HCT 38.8  --  34.6*  --   --   PLT 218  --  178  --   --   HEPARINUNFRC  --   --   --  0.25* 0.43  CREATININE 0.78  --   --   --   --   TROPONINIHS 866* 745* 734*  --   --     Estimated Creatinine Clearance: 59 mL/min (by C-G formula based on SCr of 0.78 mg/dL).   Medical History: Past Medical History:  Diagnosis Date   CAD (coronary artery disease) 1999   status post angioplasty w/out stent placement in 1999 done by Dr. Juanda Chance. According to pt, a stent was not placed due to small size vessel. No cardiac events since then.   GERD (gastroesophageal reflux disease)    HLD (hyperlipidemia)    HTN (hypertension)    Mitral valve prolapse    Osteoporosis    TIA (transient ischemic attack)    Assessment: Kristen Maynard is a 77 y.o. year old female presents with chest pain on 08/07/2023 with concern for ACS. Troponin 866 in ED. No anticoagulation prior to admission. Pharmacy consulted to dose heparin.  Heparin level slightly subtherapeutic this morning so rate increased to 1000 units/hr.  HL now is 0.43.  No overt s/sx bleeding. No issues with infusion.  Goal of Therapy:  Heparin level 0.3-0.7 units/ml Monitor platelets by anticoagulation protocol:  Yes   Plan:  Continue heparin infusion at 1000 units/hr 8 heparin level  Daily heparin level, CBC, and monitoring for bleeding F/u plans for anticoagulation and cath   Delmar Landau, PharmD, BCPS 08/08/2023 2:43 PM ED Clinical Pharmacist -  [phone]

## 2023-08-08 NOTE — ED Notes (Signed)
Patient ambulatory to restroom with steady gait.

## 2023-08-08 NOTE — ED Notes (Signed)
Pt ambulatory to bathroom at this time.

## 2023-08-08 NOTE — Progress Notes (Signed)
PHARMACY - ANTICOAGULATION CONSULT NOTE  Pharmacy Consult for IV heparin Indication: chest pain/ACS  Allergies  Allergen Reactions   Aggrenox [Aspirin-Dipyridamole Er] Other (See Comments)    Headaches maybe? Told never to take med   Codeine Other (See Comments)    Confusion   Sulfa Antibiotics Other (See Comments)    Seizures     Patient Measurements: Height: 5\' 6"  (167.6 cm) Weight: 74.8 kg (165 lb) IBW/kg (Calculated) : 59.3 Heparin Dosing Weight: 74.3 kg  Vital Signs: Temp: 98 F (36.7 C) (10/12 0500) BP: 150/82 (10/12 0600) Pulse Rate: 76 (10/12 0600)  Labs: Recent Labs    08/07/23 2012 08/07/23 2322 08/08/23 0046 08/08/23 0538  HGB 13.5  --  12.0  --   HCT 38.8  --  34.6*  --   PLT 218  --  178  --   HEPARINUNFRC  --   --   --  0.25*  CREATININE 0.78  --   --   --   TROPONINIHS 866* 745* 734*  --     Estimated Creatinine Clearance: 59 mL/min (by C-G formula based on SCr of 0.78 mg/dL).   Medical History: Past Medical History:  Diagnosis Date   CAD (coronary artery disease) 1999   status post angioplasty w/out stent placement in 1999 done by Dr. Juanda Chance. According to pt, a stent was not placed due to small size vessel. No cardiac events since then.   GERD (gastroesophageal reflux disease)    HLD (hyperlipidemia)    HTN (hypertension)    Mitral valve prolapse    Osteoporosis    TIA (transient ischemic attack)    Assessment: Kristen Maynard is a 77 y.o. year old female presents with chest pain on 08/07/2023 with concern for ACS. Troponin 866 in ED. No anticoagulation prior to admission. Pharmacy consulted to dose heparin.  Heparin level 0.25, slightly subtherapeutic  No overt s/sx bleeding. No issues with infusion, heparin running appropriately at 850 units/hr.   Goal of Therapy:  Heparin level 0.3-0.7 units/ml Monitor platelets by anticoagulation protocol: Yes   Plan:  Increase heparin infusion to 1000 units/hr 8 heparin level  Daily heparin  level, CBC, and monitoring for bleeding F/u plans for anticoagulation and cath   Thank you for allowing pharmacy to participate in this patient's care.  Marja Kays, PharmD Emergency Medicine Clinical Pharmacist 08/08/2023,6:16 AM

## 2023-08-08 NOTE — ED Notes (Signed)
ED TO INPATIENT HANDOFF REPORT  ED Nurse Name and Phone #: Alvino Chapel 58  S Name/Age/Gender Kristen Maynard 77 y.o. female Room/Bed: 009C/009C  Code Status   Code Status: Full Code  Home/SNF/Other Home Patient oriented to: self, place, time, and situation Is this baseline? Yes   Triage Complete: Triage complete  Chief Complaint NSTEMI (non-ST elevated myocardial infarction) [facility]) [I21.4]  Triage Note Pt arrived POV from home c/o left sided CP that started last night that has been intermittent but radiates to her left arm. Pt has also had some dizziness and lightheadedness but denies SHOB, N/V or back pain.   Lab has called a 866 trop  acuity increased  charge  notofied   Allergies Allergies  Allergen Reactions   Aggrenox [Aspirin-Dipyridamole Er] Other (See Comments)    Headaches maybe? Told never to take med   Codeine Other (See Comments)    Confusion   Sulfa Antibiotics Other (See Comments)    Seizures     Level of Care/Admitting Diagnosis ED Disposition     ED Disposition  Admit   Condition  --   Comment  Hospital Area: [facility] [[id number]]  Level of Care: Progressive [102]  Admit to Progressive based on following criteria: CARDIOVASCULAR & THORACIC of moderate stability with acute coronary syndrome symptoms/low risk myocardial infarction/hypertensive urgency/arrhythmias/heart failure potentially compromising stability and stable post cardiovascular intervention patients.  May place patient in observation at [facility] or [facility] if equivalent level of care is available:: Yes  Covid Evaluation: Asymptomatic - no recent exposure (last 10 days) testing not required  Diagnosis: NSTEMI (non-ST elevated myocardial infarction) [facility]) [[id number]]  Admitting Physician: John Giovanni [[id number]]  Attending Physician: John Giovanni [[id number]]          B Medical/Surgery History Past Medical History:  Diagnosis Date   CAD (coronary artery  disease) 1999   status post angioplasty w/out stent placement in 1999 done by Dr. Juanda Chance. According to pt, a stent was not placed due to small size vessel. No cardiac events since then.   GERD (gastroesophageal reflux disease)    HLD (hyperlipidemia)    HTN (hypertension)    Mitral valve prolapse    Osteoporosis    TIA (transient ischemic attack)    Past Surgical History:  Procedure Laterality Date   CARDIAC CATHETERIZATION       A IV Location/Drains/Wounds Patient Lines/Drains/Airways Status     Active Line/Drains/Airways     Name Placement date Placement time Site Days   Peripheral IV 08/07/23 18 G 1" Anterior;Right Forearm 08/07/23  2200  Forearm  1   Peripheral IV 08/07/23 18 G Left;Posterior Hand 08/07/23  2315  Hand  1            Intake/Output Last 24 hours No intake or output data in the 24 hours ending 08/08/23 1336  Labs/Imaging Results for orders placed or performed during the hospital encounter of 08/07/23 (from the past 48 hour(s))  Troponin I (High Sensitivity)     Status: Abnormal   Collection Time: 08/07/23  7:58 PM  Result Value Ref Range   Troponin I (High Sensitivity) 715 (HH) <18 ng/L    Comment: CRITICAL VALUE NOTED. VALUE IS CONSISTENT WITH PREVIOUSLY REPORTED/CALLED VALUE (NOTE) Elevated high sensitivity troponin I (hsTnI) values and significant  changes across serial measurements may suggest ACS but many other  chronic and acute conditions are known to elevate hsTnI results.  Refer to the "Links" section for chest pain algorithms and additional  guidance. Performed at [facility], [address], [place], [place] [address]   Basic metabolic panel     Status: Abnormal   Collection Time: 08/07/23  8:12 PM  Result Value Ref Range   Sodium 141 135 - 145 mmol/L   Potassium 4.1 3.5 - 5.1 mmol/L   Chloride 104 98 - 111 mmol/L   CO2 24 22 - 32 mmol/L   Glucose, Bld 100 (H) 70 - 99 mg/dL    Comment: Glucose reference range applies only to  samples taken after fasting for at least 8 hours.   BUN 8 8 - 23 mg/dL   Creatinine, Ser 6.04 0.44 - 1.00 mg/dL   Calcium 9.2 8.9 - 54.0 mg/dL   GFR, Estimated >98 >11 mL/min    Comment: (NOTE) Calculated using the CKD-EPI Creatinine Equation (2021)    Anion gap 13 5 - 15    Comment: Performed at [facility], [address], [place], [place] [address]  CBC     Status: None   Collection Time: 08/07/23  8:12 PM  Result Value Ref Range   WBC 7.1 4.0 - 10.5 K/uL   RBC 4.36 3.87 - 5.11 MIL/uL   Hemoglobin 13.5 12.0 - 15.0 g/dL   HCT 29.5 62.1 - 30.8 %   MCV 89.0 80.0 - 100.0 fL   MCH 31.0 26.0 - 34.0 pg   MCHC 34.8 30.0 - 36.0 g/dL   RDW 65.7 84.6 - 96.2 %   Platelets 218 150 - 400 K/uL   nRBC 0.0 0.0 - 0.2 %    Comment: Performed at [facility], [address], [place], [place] [address]  Troponin I (High Sensitivity)     Status: Abnormal   Collection Time: 08/07/23  8:12 PM  Result Value Ref Range   Troponin I (High Sensitivity) 866 (HH) <18 ng/L    Comment: CRITICAL RESULT CALLED TO, READ BACK BY AND VERIFIED WITH CRISCO, K. RN @ 2055 08/07/23 JBUTLER (NOTE) Elevated high sensitivity troponin I (hsTnI) values and significant  changes across serial measurements may suggest ACS but many other  chronic and acute conditions are known to elevate hsTnI results.  Refer to the "Links" section for chest pain algorithms and additional  guidance. Performed at [facility], [address], [place], [place] [address]   Troponin I (High Sensitivity)     Status: Abnormal   Collection Time: 08/07/23 11:22 PM  Result Value Ref Range   Troponin I (High Sensitivity) 745 (HH) <18 ng/L    Comment: CRITICAL VALUE NOTED. VALUE IS CONSISTENT WITH PREVIOUSLY REPORTED/CALLED VALUE (NOTE) Elevated high sensitivity troponin I (hsTnI) values and significant  changes across serial measurements may suggest ACS but many other  chronic and acute conditions are known to elevate hsTnI  results.  Refer to the "Links" section for chest pain algorithms and additional  guidance. Performed at [facility], [address], [place], [place] [address]   CBC     Status: Abnormal   Collection Time: 08/08/23 12:46 AM  Result Value Ref Range   WBC 6.8 4.0 - 10.5 K/uL   RBC 3.98 3.87 - 5.11 MIL/uL   Hemoglobin 12.0 12.0 - 15.0 g/dL   HCT 25.3 (L) 66.4 - 40.3 %   MCV 86.9 80.0 - 100.0 fL   MCH 30.2 26.0 - 34.0 pg   MCHC 34.7 30.0 - 36.0 g/dL   RDW 47.4 25.9 - 56.3 %   Platelets 178  150 - 400 K/uL   nRBC 0.0 0.0 - 0.2 %    Comment: Performed at [facility], [address], [place], [place] [address]  Troponin I (High Sensitivity)     Status: Abnormal   Collection Time: 08/08/23 12:46 AM  Result Value Ref Range   Troponin I (High Sensitivity) 734 (HH) <18 ng/L    Comment: (NOTE) Elevated high sensitivity troponin I (hsTnI) values and significant  changes across serial measurements may suggest ACS but many other  chronic and acute conditions are known to elevate hsTnI results.  Refer to the "Links" section for chest pain algorithms and additional  guidance. Performed at [facility], [address], [place], [place] [address]   Magnesium     Status: None   Collection Time: 08/08/23 12:46 AM  Result Value Ref Range   Magnesium 2.0 1.7 - 2.4 mg/dL    Comment: Performed at [facility], [address] [place], [place] [address]  Heparin level (unfractionated)     Status: Abnormal   Collection Time: 08/08/23  5:38 AM  Result Value Ref Range   Heparin Unfractionated 0.25 (L) 0.30 - 0.70 IU/mL    Comment: (NOTE) The clinical reportable range upper limit is being lowered to >1.10 to align with the FDA approved guidance for the current laboratory assay.  If heparin results are below expected values, and patient dosage has  been confirmed, suggest follow up testing of antithrombin III levels. Performed at [facility], [address]  [place], [place] [address]    DG Chest 2 View  Result Date: 08/07/2023 CLINICAL DATA:  Chest pain. EXAM: CHEST - 2 VIEW COMPARISON:  05/14/2018. FINDINGS: The heart size and mediastinal contours are within normal limits. There is atherosclerotic calcification of the aorta. No consolidation, effusion, or pneumothorax. Degenerative changes are present in the thoracic spine. No acute osseous abnormality. IMPRESSION: No active cardiopulmonary disease. Electronically Signed   By: Thornell Sartorius M.D.   On: 08/07/2023 22:34    Pending Labs Unresulted Labs (From admission, onward)     Start     Ordered   08/09/23 0500  Heparin level (unfractionated)  Daily,   R      08/07/23 2235   08/08/23 1400  Heparin level (unfractionated)  Once-Timed,   TIMED        08/08/23 0644   08/08/23 0500  CBC  Daily,   R      08/07/23 2235            Vitals/Pain Today's Vitals   08/08/23 0900 08/08/23 1000 08/08/23 1010 08/08/23 1100  BP: (!) 163/83 (!) 147/79 (!) 148/63 (!) 143/65  Pulse: 87 82 78 72  Resp: 13 14 19 18   Temp:   97.7 F (36.5 C)   TempSrc:   Oral   SpO2: 100% 97% 97% 100%  Weight:      Height:      PainSc:        Isolation Precautions No active isolations  Medications Medications  heparin ADULT infusion 100 units/mL (25000 units/25mL) (1,000 Units/hr Intravenous Rate/Dose Change 08/08/23 0639)  aspirin chewable tablet 81 mg (81 mg Oral Given 08/08/23 0913)  atenolol (TENORMIN) tablet 50 mg (50 mg Oral Given 08/08/23 0913)  simvastatin (ZOCOR) tablet 40 mg (has no administration in time range)  pantoprazole (PROTONIX) EC tablet 40 mg (40 mg Oral Given 08/08/23 0913)  fesoterodine (TOVIAZ) tablet 4 mg (4 mg Oral Given 08/08/23 0914)  clopidogrel (PLAVIX) tablet 75 mg (75 mg Oral Given 08/08/23 0914)  loratadine (CLARITIN) tablet 10 mg (10 mg Oral Given 08/08/23 0913)  losartan (COZAAR) tablet 100 mg (100 mg Oral Given 08/08/23 0913)  acetaminophen (TYLENOL) tablet 650 mg (has no  administration in time range)    Or  acetaminophen (TYLENOL) suppository 650 mg (has no administration in time range)  nitroGLYCERIN (NITROSTAT) SL tablet 0.4 mg (has no administration in time range)  perflutren lipid microspheres (DEFINITY) IV suspension (2 mLs Intravenous Given 08/08/23 1157)  heparin bolus via infusion 4,000 Units (4,000 Units Intravenous Bolus from Bag 08/07/23 2301)    Mobility walks     Focused Assessments Cardiac Assessment Handoff:  R Recommendations: See Admitting Provider Note  Report given to:   Additional Notes:

## 2023-08-08 NOTE — ED Notes (Signed)
Pt transported to echo

## 2023-08-08 NOTE — Progress Notes (Signed)
HOSPITALIST ROUNDING NOTE Kristen Maynard ZOX:[id number]  DOB: [birth year]  DOA: 08/07/2023  PCP: Paulina Fusi, MD  08/08/2023,7:13 AM   LOS: 0 days      Code Status: Full From: Home  current Dispo: Unclear    24 female with known CAD + (too small to PCI) 1999 Dr. Dickie La previous treadmill stress test no evidence of ischemia Known underlying palpitations-event monitor 2016 NSR Myoview EF 75% no ischemia scar HTN Urinary incontinence Pseudophakia TIA on aspirin Plavix Presented from home left-sided chest pain dizzy lightheaded and setting of moving several boxes loss of balance Troponin elevation 700 EKG without acute changes started on heparin gtt. ordered SL nitro ordered  Admitted, cardiology consulted started on heparin     Plan  NSTEMI on admission-- underlying CAD too small to PCI 1999  HTN No current chest pain-IV heparin gtt. as per cardiology-continues on aspirin 81 Plavix 75 Continue GDMT Cozaar 100 Zocor 40 atenolol 50  echo performed and pending  cath on Monday-rest as per cardiology   prior TIA CVA greater than 25 years ago  continue on meds as above   urinary incontinence Continue Vesicare 5 daily-resume cranberry capsules as an outpatient  Reflux  continue PPI   Pseudophakia  outpatient ophthalmology follow-up  DVT prophylaxis:  heparin GTT  Status is: Observation The patient will require care spanning > 2 midnights and should be moved to inpatient because:    will need cath      Subjective:  awake coherent no distress looks well no chest pain  Objective + exam Vitals:   08/08/23 0515 08/08/23 0530 08/08/23 0545 08/08/23 0600  BP: (!) 169/73 (!) 162/78 (!) 152/77 (!) 150/82  Pulse: 81 80 81 76  Resp: (!) 24 17 18  (!) 21  Temp:      SpO2: 100% 93% 100% 100%  Weight:      Height:       Filed Weights   08/07/23 2003  Weight: 74.8 kg    Examination:   EOMI NCAT no focal deficit no icterus no pallor Chest is clear no wheeze Trace  JVD noted Abdomen soft no rebound No lower EXTR edema Power 5/5 Smile symmetric Otherwise ROM grossly intact  Data Reviewed: reviewed   CBC    Component Value Date/Time   WBC 6.8 08/08/2023 0046   RBC 3.98 08/08/2023 0046   HGB 12.0 08/08/2023 0046   HCT 34.6 (L) 08/08/2023 0046   PLT 178 08/08/2023 0046   MCV 86.9 08/08/2023 0046   MCH 30.2 08/08/2023 0046   MCHC 34.7 08/08/2023 0046   RDW 13.1 08/08/2023 0046   LYMPHSABS 0.7 07/03/2015 0648   MONOABS 0.3 07/03/2015 0648   EOSABS 0.1 07/03/2015 0648   BASOSABS 0.0 07/03/2015 0648      Latest Ref Rng & Units 08/07/2023    8:12 PM 07/04/2015   12:53 AM 07/03/2015    6:48 AM  CMP  Glucose 70 - 99 mg/dL 811  914  782   BUN 8 - 23 mg/dL 8  13  18    Creatinine 0.44 - 1.00 mg/dL 9.56  2.13  0.86   Sodium 135 - 145 mmol/L 141  139  139   Potassium 3.5 - 5.1 mmol/L 4.1  4.1  3.8   Chloride 98 - 111 mmol/L 104  109  107   CO2 22 - 32 mmol/L 24  25  23    Calcium 8.9 - 10.3 mg/dL 9.2  8.4  8.4   Total Protein  6.5 - 8.1 g/dL   5.8   Total Bilirubin 0.3 - 1.2 mg/dL   0.6   Alkaline Phos 38 - 126 U/L   85   AST 15 - 41 U/L   24   ALT 14 - 54 U/L   21      Scheduled Meds:  aspirin  81 mg Oral Daily   atenolol  50 mg Oral Daily   clopidogrel  75 mg Oral Daily   fesoterodine  4 mg Oral Daily   loratadine  10 mg Oral Daily   losartan  100 mg Oral Daily   pantoprazole  40 mg Oral Daily   simvastatin  40 mg Oral q1800   Continuous Infusions:  heparin 1,000 Units/hr (08/08/23 1610)    Time  50  Rhetta Mura, MD  [facility]

## 2023-08-08 NOTE — Progress Notes (Signed)
PHARMACY - ANTICOAGULATION CONSULT NOTE  Pharmacy Consult for IV heparin Indication: chest pain/ACS  Allergies  Allergen Reactions   Aggrenox [Aspirin-Dipyridamole Er] Other (See Comments)    Headaches maybe? Told never to take med   Codeine Other (See Comments)    Confusion   Sulfa Antibiotics Other (See Comments)    Seizures     Patient Measurements: Height: 5\' 6"  (167.6 cm) Weight: 74.8 kg (165 lb) IBW/kg (Calculated) : 59.3 Heparin Dosing Weight: 74.3 kg  Vital Signs: Temp: 98.6 F (37 C) (10/12 2251) Temp Source: Oral (10/12 2251) BP: 96/57 (10/12 2251) Pulse Rate: 84 (10/12 2251)  Labs: Recent Labs    08/07/23 2012 08/07/23 2322 08/08/23 0046 08/08/23 0538 08/08/23 1350 08/08/23 2243  HGB 13.5  --  12.0  --   --   --   HCT 38.8  --  34.6*  --   --   --   PLT 218  --  178  --   --   --   HEPARINUNFRC  --   --   --  0.25* 0.43 0.52  CREATININE 0.78  --   --   --   --   --   TROPONINIHS 866* 745* 734*  --   --   --     Estimated Creatinine Clearance: 59 mL/min (by C-G formula based on SCr of 0.78 mg/dL).   Medical History: Past Medical History:  Diagnosis Date   CAD (coronary artery disease) 1999   status post angioplasty w/out stent placement in 1999 done by Dr. Juanda Chance. According to pt, a stent was not placed due to small size vessel. No cardiac events since then.   GERD (gastroesophageal reflux disease)    HLD (hyperlipidemia)    HTN (hypertension)    Mitral valve prolapse    Osteoporosis    TIA (transient ischemic attack)    Assessment: Kristen Maynard is a 77 y.o. year old female presents with chest pain on 08/07/2023 with concern for ACS. Troponin 866 in ED. No anticoagulation prior to admission. Pharmacy consulted to dose heparin.  HL now is 0.52 on 1000 units/hr (therapeutic x2). No signs/symptoms of bleeding noted or issues with infusion. Last CBC stable  Goal of Therapy:  Heparin level 0.3-0.7 units/ml Monitor platelets by  anticoagulation protocol: Yes   Plan:  Continue heparin infusion at 1000 units/hr Daily heparin level, CBC, and monitoring for bleeding F/u plans for anticoagulation and cath  [name], PharmD. Clinical Pharmacist 08/08/2023 11:09 PM

## 2023-08-08 NOTE — ED Notes (Signed)
Admitting provider at bedside.

## 2023-08-08 NOTE — ED Notes (Signed)
Lavender top sent to lab...KM

## 2023-08-08 NOTE — Progress Notes (Addendum)
Rounding Note    Patient Name: Kristen Maynard Date of Encounter: 08/08/2023  [facility] Cardiologist: New to [facility] - Dr. Odis Hollingshead  Subjective   Per daughter, one episode of chest pain on Thursday and another episode yesterday. No further chest pain at this time.   Inpatient Medications    Scheduled Meds:  aspirin  81 mg Oral Daily   atenolol  50 mg Oral Daily   clopidogrel  75 mg Oral Daily   fesoterodine  4 mg Oral Daily   loratadine  10 mg Oral Daily   losartan  100 mg Oral Daily   pantoprazole  40 mg Oral Daily   simvastatin  40 mg Oral q1800   Continuous Infusions:  heparin 1,000 Units/hr (08/08/23 0639)   PRN Meds: acetaminophen **OR** acetaminophen, nitroGLYCERIN, perflutren lipid microspheres (DEFINITY) IV suspension   Vital Signs    Vitals:   08/08/23 0900 08/08/23 1000 08/08/23 1010 08/08/23 1100  BP: (!) 163/83 (!) 147/79 (!) 148/63 (!) 143/65  Pulse: 87 82 78 72  Resp: 13 14 19 18   Temp:   97.7 F (36.5 C)   TempSrc:   Oral   SpO2: 100% 97% 97% 100%  Weight:      Height:       No intake or output data in the 24 hours ending 08/08/23 1253    08/07/2023    8:03 PM 08/29/2015   10:32 AM 07/18/2015   11:39 AM  Last 3 Weights  Weight (lbs) 165 lb 155 lb 6.4 oz 158 lb  Weight (kg) 74.844 kg 70.489 kg 71.668 kg      Telemetry    NSR without significant ventricular ectopy - Personally Reviewed  ECG    NSR without significant ST-T wave changes - Personally Reviewed  Physical Exam   GEN: No acute distress.   Neck: No JVD Cardiac: RRR, no murmurs, rubs, or gallops.  Respiratory: Clear to auscultation bilaterally. GI: Soft, nontender, non-distended  MS: No edema; No deformity. Neuro:  Nonfocal  Psych: Normal affect   Labs    High Sensitivity Troponin:   Recent Labs  Lab 08/07/23 1958 08/07/23 2012 08/07/23 2322 08/08/23 0046  TROPONINIHS 715* 866* 745* 734*     Chemistry Recent Labs  Lab 08/07/23 2012  08/08/23 0046  NA 141  --   K 4.1  --   CL 104  --   CO2 24  --   GLUCOSE 100*  --   BUN 8  --   CREATININE 0.78  --   CALCIUM 9.2  --   MG  --  2.0  GFRNONAA >60  --   ANIONGAP 13  --     Lipids No results for input(s): "CHOL", "TRIG", "HDL", "LABVLDL", "LDLCALC", "CHOLHDL" in the last 168 hours.  Hematology Recent Labs  Lab 08/07/23 2012 08/08/23 0046  WBC 7.1 6.8  RBC 4.36 3.98  HGB 13.5 12.0  HCT 38.8 34.6*  MCV 89.0 86.9  MCH 31.0 30.2  MCHC 34.8 34.7  RDW 13.0 13.1  PLT 218 178   Thyroid No results for input(s): "TSH", "FREET4" in the last 168 hours.  BNPNo results for input(s): "BNP", "PROBNP" in the last 168 hours.  DDimer No results for input(s): "DDIMER" in the last 168 hours.   Radiology    DG Chest 2 View  Result Date: 08/07/2023 CLINICAL DATA:  Chest pain. EXAM: CHEST - 2 VIEW COMPARISON:  05/14/2018. FINDINGS: The heart size and mediastinal contours are within  normal limits. There is atherosclerotic calcification of the aorta. No consolidation, effusion, or pneumothorax. Degenerative changes are present in the thoracic spine. No acute osseous abnormality. IMPRESSION: No active cardiopulmonary disease. Electronically Signed   By: Thornell Sartorius M.D.   On: 08/07/2023 22:34    Cardiac Studies   Echo 07/03/2015 LV EF: 65% -   70%   ------------------------------------------  Study Conclusions   - Left ventricle: The cavity size was normal. Wall thickness was    normal. Systolic function was vigorous. The estimated ejection    fraction was in the range of 65% to 70%. Wall motion was normal;    there were no regional wall motion abnormalities.   Impressions:   - Compared to the prior study, there has been no significant    interval change.     Myoview 07/18/2015 Nuclear stress EF: 75%. There was no ST segment deviation noted during stress. This is a low risk study. There is increased uptake in structures below the diaphragm which makes  intrepretation difficult. There is no evidence of ischemia or infarction . The study is normal.  Patient Profile     77 y.o. female with PMH of CAD s/p PCI in 1999 by Dr. Juanda Chance, HTN, HLD, mitral valve prolapse and h/o CVA who presented with chest pain, serial trop elevated around 800.   Assessment & Plan    NSTEMI - hs serial trop 715 --> 866 --> 745 -->  734 - EKG showed no acute ST-T wave changes. CXR normal - symptom concerning for angina, pending MD review, plan for cardiac catheterization for Monday. Patient is currently chest pain free. - Risk and benefit of procedure explained to the patient who display clear understanding and agree to proceed. Discussed with patient possible procedural risk include bleeding, vascular injury, renal injury, arrythmia, MI, stroke and loss of limb or life.  CAD: PCI in 1999. Normal myoview in Sept 2016. EF 65-70% on previous echo in 2016.    HTN: on Atenolol and losartan. BP elevated in ED, monitor  HLD: on Zocor  H/o CVA: short-term memory issue      For questions or updates, please contact [facility] Please consult [email or web address] for contact info under     Signed, Charlena Haub, DO  08/08/2023,   ADDENDUM:   Patient seen and examined with APP.  I personally taken a history, examined the patient, reviewed relevant notes,  laboratory data / imaging studies.  I performed a substantive portion of this encounter and formulated the important aspects of the plan.  I agree with the APP's note, impression, and recommendations. I have personally discussed the plan with the patient and daughter [name].  Patient has been having precordial discomfort for the last 2 days, substernally located, left-sided as well, worse with exertion, improves with rest.  Presents to the ED for evaluation and ruled in for NSTEMI.  Cardiology is consulted for further management.  PHYSICAL EXAM:    08/08/2023   11:00 AM 08/08/2023   10:10 AM 08/08/2023    10:00 AM  Vitals with BMI  Systolic 143 148 191  Diastolic 65 63 79  Pulse 72 78 82    Net IO Since Admission: No IO data has been entered for this period [08/08/23 1253]  Filed Weights   08/07/23 2003  Weight: 74.8 kg      Latest Ref Rng & Units 08/07/2023    8:12 PM 07/04/2015   12:53 AM 07/03/2015    6:48 AM  BMP  Glucose 70 - 99 mg/dL 147  829  562   BUN 8 - 23 mg/dL 8  13  18    Creatinine 0.44 - 1.00 mg/dL 1.30  8.65  7.84   Sodium 135 - 145 mmol/L 141  139  139   Potassium 3.5 - 5.1 mmol/L 4.1  4.1  3.8   Chloride 98 - 111 mmol/L 104  109  107   CO2 22 - 32 mmol/L 24  25  23    Calcium 8.9 - 10.3 mg/dL 9.2  8.4  8.4    Cardiac Panel (last 3 results) Recent Labs    08/07/23 2012 08/07/23 2322 08/08/23 0046  TROPONINIHS 866* 745* 734*    Physical Exam  Constitutional: No distress.  hemodynamically stable, age-appropriate female.  Neck: No JVD present.  Cardiovascular: Normal rate, regular rhythm, S1 normal, S2 normal, intact distal pulses and normal pulses. Exam reveals no gallop, no S3 and no S4.  No murmur heard. Pulmonary/Chest: Effort normal and breath sounds normal. No stridor. She has no wheezes. She has no rales.  Abdominal: Soft. Bowel sounds are normal. She exhibits no distension. There is no abdominal tenderness.  Musculoskeletal:        General: No edema.     Cervical back: Neck supple.  Neurological: She is alert and oriented to person, place, and time. She has intact cranial nerves (2-12).  Skin: Skin is warm and moist.   12 lead EKG was personally reviewed by me: 08/07/2023 2003: Sinus rhythm, 85 bpm, normal axis, without underlying ischemia or injury pattern, prolonged QT   Telemetry: Predominately sinus with rare ectopy   Assessment/Plan: NSTEMI: Known history of CAD with angina pectoris: EKG does not illustrate myocardial injury pattern. High sensitive troponins peaked at 866 Currently denies anginal chest pain Echocardiogram  pending Tentatively scheduled for left heart catheterization with possible intervention on Monday, 08/10/2023 Recommend checking fasting lipids, direct LDL Reemphasized the importance of focus on improving her modifiable cardiovascular risk factors such as glycemic control, lipid management, blood pressure control, weight loss. Plan of care discussed with her daughter Angie at bedside.  Informed Consent   Shared Decision Making/Informed Consent The risks [stroke (1 in 1000), death (1 in 1000), kidney failure [usually temporary] (1 in 500), bleeding (1 in 200), allergic reaction [possibly serious] (1 in 200)], benefits (diagnostic support and management of coronary artery disease) and alternatives of a cardiac catheterization were discussed in detail with Ms. Hillock and she is willing to proceed.    Prolonged QT: Continue telemetry. Optimize electrolytes Monitor for now  Benign essential hypertension: Continue home medications. Monitor for now. Medication titration per primary team.  Hyperlipidemia: Continue statin therapy, will check fasting lipids as discussed above.  History of CVA: Reemphasized importance of secondary prevention. No obvious residual deficits with the exception of short-term memory deficits  Further recommendations to follow as the case evolves.   This note was created using a voice recognition software as a result there may be grammatical errors inadvertently enclosed that do not reflect the nature of this encounter. Every attempt is made to correct such errors.   Tessa Lerner, DO, [facility]  [facility]  [address] [place], [place] [address] 08/08/2023 12:53 PM

## 2023-08-08 NOTE — H&P (Signed)
History and Physical    Kristen Maynard ZOX:[id number] DOB: 1946/07/19 DOA: 08/07/2023  PCP: Paulina Fusi, MD  Patient coming from: Home  Chief Complaint: Chest pain  HPI: Kristen Maynard is a 77 y.o. female with medical history significant of CAD status post POBA in 1999, hypertension hyperlipidemia, GERD, mitral valve prolapse, osteoporosis, CVA/TIA presented to ED with left-sided chest pain associated with dizziness/lightheadedness.  Blood pressure 185/85 on arrival, remainder of vital signs stable.  Labs significant for troponin 715> 866> 745> 734.  EKG without acute ischemic changes.  Chest x-ray showing no acute cardiopulmonary disease. Cardiology consulted and patient was started on heparin drip due to concern for NSTEMI.  TRH called to admit.  Patient is reporting 24-hour history of intermittent, 5 out of 10 intensity left-sided chest pain associated with dizziness but no diaphoresis, nausea/vomiting, or dyspnea.  She is not able to describe what the pain feels like.  Denies any chest pain at present.  She denies history of any exertional chest pain in the past.  No other complaints.  Review of Systems:  Review of Systems  All other systems reviewed and are negative.   Past Medical History:  Diagnosis Date   CAD (coronary artery disease) 1999   status post angioplasty w/out stent placement in 1999 done by Dr. Juanda Chance. According to pt, a stent was not placed due to small size vessel. No cardiac events since then.   GERD (gastroesophageal reflux disease)    HLD (hyperlipidemia)    HTN (hypertension)    Mitral valve prolapse    Osteoporosis    TIA (transient ischemic attack)     Past Surgical History:  Procedure Laterality Date   CARDIAC CATHETERIZATION       reports that she has never smoked. She does not have any smokeless tobacco history on file. She reports that she does not drink alcohol and does not use drugs.  Allergies  Allergen Reactions   Aggrenox  [Aspirin-Dipyridamole Er] Other (See Comments)    Headaches maybe? Told never to take med   Codeine Other (See Comments)    Confusion   Sulfa Antibiotics Other (See Comments)    Seizures     Family History  Problem Relation Age of Onset   Lung cancer Mother    COPD Father     Prior to Admission medications   Medication Sig Start Date End Date Taking? Authorizing Provider  acetaminophen (TYLENOL) 325 MG tablet Take 650 mg by mouth every 6 (six) hours as needed for moderate pain.   Yes [provider]  aspirin 81 MG chewable tablet Chew 1 tablet (81 mg total) by mouth daily. 07/04/15  Yes Vann, Jessica U, DO  atenolol (TENORMIN) 50 MG tablet Take 50 mg by mouth daily.     Yes [provider]  Calcium-Magnesium-Vitamin D (CALCIUM 1200+D3 PO) Take 1 capsule by mouth in the morning and at bedtime.   Yes [provider]  clopidogrel (PLAVIX) 75 MG tablet Take 75 mg by mouth daily.     Yes [provider]  CRANBERRY PO Take 1,000 mg by mouth daily.   Yes [provider]  ferrous sulfate 325 (65 FE) MG tablet Take 325 mg by mouth daily with breakfast.   Yes [provider]  loratadine (CLARITIN) 10 MG tablet Take 10 mg by mouth daily.   Yes [provider]  losartan (COZAAR) 100 MG tablet Take 100 mg by mouth daily.   Yes [provider]  Multiple  Vitamin (MULTIVITAMIN WITH MINERALS) TABS tablet Take 1 tablet by mouth daily.   Yes [provider]  omeprazole (PRILOSEC) 40 MG capsule Take 40 mg by mouth daily.   Yes [provider]  simvastatin (ZOCOR) 40 MG tablet Take 40 mg by mouth daily at 6 PM.   Yes [provider]  solifenacin (VESICARE) 5 MG tablet Take 5 mg by mouth daily. 06/24/23  Yes [provider]    Physical Exam: Vitals:   08/07/23 2215 08/07/23 2315 08/08/23 0030 08/08/23 0132  BP: (!) 165/70 (!) 165/69 (!) 151/74   Pulse: 78 82 80   Resp: 15 20 16    Temp:    98 F  (36.7 C)  SpO2: 99% 97% 92%   Weight:      Height:        Physical Exam Vitals reviewed.  Constitutional:      General: She is not in acute distress. HENT:     Head: Normocephalic and atraumatic.  Eyes:     Extraocular Movements: Extraocular movements intact.  Cardiovascular:     Rate and Rhythm: Normal rate and regular rhythm.     Pulses: Normal pulses.  Pulmonary:     Effort: Pulmonary effort is normal. No respiratory distress.     Breath sounds: Normal breath sounds. No wheezing or rales.  Abdominal:     General: Bowel sounds are normal. There is no distension.     Palpations: Abdomen is soft.     Tenderness: There is no abdominal tenderness. There is no guarding.  Musculoskeletal:     Cervical back: Normal range of motion.     Right lower leg: No edema.     Left lower leg: No edema.  Skin:    General: Skin is warm and dry.  Neurological:     General: No focal deficit present.     Mental Status: She is alert and oriented to person, place, and time.     Labs on Admission: I have personally reviewed following labs and imaging studies  CBC: Recent Labs  Lab 08/07/23 2012 08/08/23 0046  WBC 7.1 6.8  HGB 13.5 12.0  HCT 38.8 34.6*  MCV 89.0 86.9  PLT 218 178   Basic Metabolic Panel: Recent Labs  Lab 08/07/23 2012  NA 141  K 4.1  CL 104  CO2 24  GLUCOSE 100*  BUN 8  CREATININE 0.78  CALCIUM 9.2   GFR: Estimated Creatinine Clearance: 59 mL/min (by C-G formula based on SCr of 0.78 mg/dL). Liver Function Tests: No results for input(s): "AST", "ALT", "ALKPHOS", "BILITOT", "PROT", "ALBUMIN" in the last 168 hours. No results for input(s): "LIPASE", "AMYLASE" in the last 168 hours. No results for input(s): "AMMONIA" in the last 168 hours. Coagulation Profile: No results for input(s): "INR", "PROTIME" in the last 168 hours. Cardiac Enzymes: No results for input(s): "CKTOTAL", "CKMB", "CKMBINDEX", "TROPONINI" in the last 168 hours. BNP (last 3 results) No  results for input(s): "PROBNP" in the last 8760 hours. HbA1C: No results for input(s): "HGBA1C" in the last 72 hours. CBG: No results for input(s): "GLUCAP" in the last 168 hours. Lipid Profile: No results for input(s): "CHOL", "HDL", "LDLCALC", "TRIG", "CHOLHDL", "LDLDIRECT" in the last 72 hours. Thyroid Function Tests: No results for input(s): "TSH", "T4TOTAL", "FREET4", "T3FREE", "THYROIDAB" in the last 72 hours. Anemia Panel: No results for input(s): "VITAMINB12", "FOLATE", "FERRITIN", "TIBC", "IRON", "RETICCTPCT" in the last 72 hours. Urine analysis:    Component Value Date/Time   COLORURINE YELLOW 07/02/2015  1822   APPEARANCEUR HAZY (A) 07/02/2015 1822   LABSPEC 1.026 07/02/2015 1822   PHURINE 5.0 07/02/2015 1822   GLUCOSEU NEGATIVE 07/02/2015 1822   HGBUR TRACE (A) 07/02/2015 1822   BILIRUBINUR NEGATIVE 07/02/2015 1822   KETONESUR NEGATIVE 07/02/2015 1822   PROTEINUR NEGATIVE 07/02/2015 1822   UROBILINOGEN 0.2 07/02/2015 1822   NITRITE POSITIVE (A) 07/02/2015 1822   LEUKOCYTESUR SMALL (A) 07/02/2015 1822    Radiological Exams on Admission: DG Chest 2 View  Result Date: 08/07/2023 CLINICAL DATA:  Chest pain. EXAM: CHEST - 2 VIEW COMPARISON:  05/14/2018. FINDINGS: The heart size and mediastinal contours are within normal limits. There is atherosclerotic calcification of the aorta. No consolidation, effusion, or pneumothorax. Degenerative changes are present in the thoracic spine. No acute osseous abnormality. IMPRESSION: No active cardiopulmonary disease. Electronically Signed   By: Thornell Sartorius M.D.   On: 08/07/2023 22:34    EKG: Independently reviewed.  Sinus rhythm, QTc 492.  No acute ischemic changes.  Assessment and Plan  Chest pain/possible NSTEMI History of CAD status post POBA in 1999 Troponin 715> 866> 745> 734.  EKG without acute ischemic changes.  Patient is currently chest pain-free and resting comfortably.  Cardiology consulting, appreciate recommendations.   Continue heparin drip, aspirin, Plavix, and beta-blocker.  Sublingual nitroglycerin as needed for any additional chest pain.  Echocardiogram ordered.  Cardiology will consider LHC likely on Monday but recommended keeping n.p.o. after midnight for now.  QT prolongation Keep K >4 and Mag >2.  Avoid QT prolonging drugs.  Hypertension Blood pressure now improved, systolic currently in the 150s.  Continue atenolol and losartan.  Hyperlipidemia Continue Zocor.  GERD Continue PPI.  Overactive bladder Continue home medication.  History of CVA/TIA Continue aspirin, Plavix, and statin.  DVT prophylaxis: IV heparin gtt Code Status: Full Code (discussed with the patient) Family Communication: Daughter at bedside. Consults called: Cardiology Level of care: Progressive Care Unit Admission status: It is my clinical opinion that referral for OBSERVATION is reasonable and necessary in this patient based on the above information provided. The aforementioned taken together are felt to place the patient at high risk for further clinical deterioration. However, it is anticipated that the patient may be medically stable for discharge from the hospital within 24 to 48 hours.  John Giovanni MD [facility]  If 7PM-7AM, please contact night-coverage [email or web address]  08/08/2023, 2:09 AM

## 2023-08-08 NOTE — ED Notes (Signed)
No bed alarms available

## 2023-08-09 ENCOUNTER — Encounter (HOSPITAL_COMMUNITY): Payer: Self-pay | Admitting: Internal Medicine

## 2023-08-09 DIAGNOSIS — Z8673 Personal history of transient ischemic attack (TIA), and cerebral infarction without residual deficits: Secondary | ICD-10-CM | POA: Diagnosis not present

## 2023-08-09 DIAGNOSIS — I10 Essential (primary) hypertension: Secondary | ICD-10-CM | POA: Diagnosis not present

## 2023-08-09 DIAGNOSIS — I25110 Atherosclerotic heart disease of native coronary artery with unstable angina pectoris: Secondary | ICD-10-CM | POA: Diagnosis not present

## 2023-08-09 DIAGNOSIS — I214 Non-ST elevation (NSTEMI) myocardial infarction: Secondary | ICD-10-CM | POA: Diagnosis not present

## 2023-08-09 LAB — BASIC METABOLIC PANEL
Anion gap: 9 (ref 5–15)
BUN: 16 mg/dL (ref 8–23)
CO2: 25 mmol/L (ref 22–32)
Calcium: 9.1 mg/dL (ref 8.9–10.3)
Chloride: 104 mmol/L (ref 98–111)
Creatinine, Ser: 0.97 mg/dL (ref 0.44–1.00)
GFR, Estimated: 59 mL/min — ABNORMAL LOW (ref >60)
Glucose, Bld: 145 mg/dL — ABNORMAL HIGH (ref 70–99)
Potassium: 4.1 mmol/L (ref 3.5–5.1)
Sodium: 138 mmol/L (ref 135–145)

## 2023-08-09 LAB — CBC
HCT: 37 % (ref 36.0–46.0)
Hemoglobin: 12.7 g/dL (ref 12.0–15.0)
MCH: 29.7 pg (ref 26.0–34.0)
MCHC: 34.3 g/dL (ref 30.0–36.0)
MCV: 86.4 fL (ref 80.0–100.0)
Platelets: 178 10*3/uL (ref 150–400)
RBC: 4.28 MIL/uL (ref 3.87–5.11)
RDW: 13 % (ref 11.5–15.5)
WBC: 5.5 10*3/uL (ref 4.0–10.5)
nRBC: 0 % (ref 0.0–0.2)

## 2023-08-09 LAB — TROPONIN I (HIGH SENSITIVITY): Troponin I (High Sensitivity): 734 ng/L (ref <18)

## 2023-08-09 LAB — HEPARIN LEVEL (UNFRACTIONATED): Heparin Unfractionated: 0.52 [IU]/mL (ref 0.30–0.70)

## 2023-08-09 MED ORDER — ALUM & MAG HYDROXIDE-SIMETH 200-200-20 MG/5ML PO SUSP
15.0000 mL | Freq: Four times a day (QID) | ORAL | Status: DC | PRN
  Administered 2023-08-09: 15 mL via ORAL
  Filled 2023-08-09: qty 30

## 2023-08-09 NOTE — Plan of Care (Signed)
  Problem: Activity: Goal: Ability to return to baseline activity level will improve Outcome: Progressing   Problem: Cardiovascular: Goal: Ability to achieve and maintain adequate cardiovascular perfusion will improve Outcome: Progressing   Problem: Education: Goal: Knowledge of General Education information will improve Description: Including pain rating scale, medication(s)/side effects and non-pharmacologic comfort measures Outcome: Progressing   Problem: Clinical Measurements: Goal: Ability to maintain clinical measurements within normal limits will improve Outcome: Progressing Goal: Respiratory complications will improve Outcome: Progressing Goal: Cardiovascular complication will be avoided Outcome: Progressing

## 2023-08-09 NOTE — Plan of Care (Signed)
  Problem: Education: Goal: Understanding of CV disease, CV risk reduction, and recovery process will improve Outcome: Progressing   Problem: Cardiovascular: Goal: Vascular access site(s) Level 0-1 will be maintained Outcome: Progressing   Problem: Education: Goal: Knowledge of General Education information will improve Description: Including pain rating scale, medication(s)/side effects and non-pharmacologic comfort measures Outcome: Progressing   Problem: Clinical Measurements: Goal: Ability to maintain clinical measurements within normal limits will improve Outcome: Progressing Goal: Will remain free from infection Outcome: Progressing   Problem: Nutrition: Goal: Adequate nutrition will be maintained Outcome: Progressing   Problem: Coping: Goal: Level of anxiety will decrease Outcome: Progressing   Problem: Pain Managment: Goal: General experience of comfort will improve Outcome: Progressing   Problem: Safety: Goal: Ability to remain free from injury will improve Outcome: Progressing   Problem: Skin Integrity: Goal: Risk for impaired skin integrity will decrease Outcome: Progressing

## 2023-08-09 NOTE — Progress Notes (Addendum)
HOSPITALIST ROUNDING NOTE Kristen Maynard ZOX:[id number]  DOB: 06/16/1946  DOA: 08/07/2023  PCP: Paulina Fusi, MD  08/09/2023,3:02 PM   LOS: 1 day      Code Status: Full From: Home  current Dispo: home likely   1 female with known CAD + (too small to PCI) 1999 Dr. Dickie La previous treadmill stress test no evidence of ischemia Known underlying palpitations-event monitor 2016 NSR Myoview EF 75% no ischemia scar HTN Urinary incontinence Pseudophakia TIA on aspirin Plavix Presented from home left-sided chest pain dizzy lightheaded and setting of moving several boxes loss of balance Troponin elevation 700 EKG without acute changes started on heparin gtt. ordered SL nitro ordered  Admitted, cardiology consulted started on heparin Echo EF 60-65% LVH, Grade 1 DD   Plan  NSTEMI on admission-- underlying CAD too small to PCI 1999  HTN IV heparin gtt. as per cardiology-continues on aspirin 81 Plavix 75 Continue GDMT Cozaar 100 Zocor 40 atenolol 50 Echo as above Cath in am   prior TIA CVA greater than 25 years ago  continue on meds as above   urinary incontinence Continue Vesicare 5 daily-resume cranberry capsules as an outpatient  Reflux  continue PPI   Pseudophakia  outpatient ophthalmology follow-up  D/w family at bedside  DVT prophylaxis:  heparin GTT  Status is: Observation The patient will require care spanning > 2 midnights and should be moved to inpatient because:    await cath    Subjective:  Awake coherent no cp  Objective + exam Vitals:   08/09/23 0630 08/09/23 0821 08/09/23 1027 08/09/23 1205  BP: (!) 119/44 (!) 120/93 (!) 141/74 131/68  Pulse: 81 82 80 65  Resp: 18 18  18   Temp: 97.8 F (36.6 C) (!) 96.7 F (35.9 C)  98.3 F (36.8 C)  TempSrc: Oral Oral  Oral  SpO2: 96% 96%  94%  Weight:      Height:       Filed Weights   08/07/23 2003  Weight: 74.8 kg    Examination:   EOMI NCAT no focal deficit no icterus no pallor Chest no wheeze no  JVD noted, no LE edema Abdomen soft no rebound S1 s 2no m nsr  Data Reviewed: reviewed   CBC    Component Value Date/Time   WBC 5.5 08/09/2023 0451   RBC 4.28 08/09/2023 0451   HGB 12.7 08/09/2023 0451   HCT 37.0 08/09/2023 0451   PLT 178 08/09/2023 0451   MCV 86.4 08/09/2023 0451   MCH 29.7 08/09/2023 0451   MCHC 34.3 08/09/2023 0451   RDW 13.0 08/09/2023 0451   LYMPHSABS 0.7 07/03/2015 0648   MONOABS 0.3 07/03/2015 0648   EOSABS 0.1 07/03/2015 0648   BASOSABS 0.0 07/03/2015 0648      Latest Ref Rng & Units 08/09/2023    1:23 PM 08/07/2023    8:12 PM 07/04/2015   12:53 AM  CMP  Glucose 70 - 99 mg/dL 811  914  782   BUN 8 - 23 mg/dL 16  8  13    Creatinine 0.44 - 1.00 mg/dL 9.56  2.13  0.86   Sodium 135 - 145 mmol/L 138  141  139   Potassium 3.5 - 5.1 mmol/L 4.1  4.1  4.1   Chloride 98 - 111 mmol/L 104  104  109   CO2 22 - 32 mmol/L 25  24  25    Calcium 8.9 - 10.3 mg/dL 9.1  9.2  8.4  Scheduled Meds:  aspirin  81 mg Oral Daily   [START ON 08/10/2023] aspirin  81 mg Oral Pre-Cath   atenolol  50 mg Oral Daily   clopidogrel  75 mg Oral Daily   fesoterodine  4 mg Oral Daily   loratadine  10 mg Oral Daily   losartan  100 mg Oral Daily   pantoprazole  40 mg Oral Daily   simvastatin  40 mg Oral q1800   Continuous Infusions:  [START ON 08/10/2023] sodium chloride     Followed by   Melene Muller ON 08/10/2023] sodium chloride     heparin 1,000 Units/hr (08/08/23 1800)    Time  30  Rhetta Mura, MD  [facility]

## 2023-08-09 NOTE — Progress Notes (Signed)
PHARMACY - ANTICOAGULATION CONSULT NOTE  Pharmacy Consult for IV heparin Indication: chest pain/ACS  Allergies  Allergen Reactions   Aggrenox [Aspirin-Dipyridamole Er] Other (See Comments)    Headaches maybe? Told never to take med   Codeine Other (See Comments)    Confusion   Sulfa Antibiotics Other (See Comments)    Seizures     Patient Measurements: Height: 5\' 6"  (167.6 cm) Weight: 74.8 kg (165 lb) IBW/kg (Calculated) : 59.3 Heparin Dosing Weight: 74.3 kg  Vital Signs: Temp: 97.8 F (36.6 C) (10/13 0630) Temp Source: Oral (10/13 0630) BP: 119/44 (10/13 0630) Pulse Rate: 81 (10/13 0630)  Labs: Recent Labs    08/07/23 2012 08/07/23 2322 08/08/23 0046 08/08/23 0538 08/08/23 1350 08/08/23 2243 08/09/23 0451  HGB 13.5  --  12.0  --   --   --  12.7  HCT 38.8  --  34.6*  --   --   --  37.0  PLT 218  --  178  --   --   --  178  HEPARINUNFRC  --   --   --    < > 0.43 0.52 0.52  CREATININE 0.78  --   --   --   --   --   --   TROPONINIHS 866* 745* 734*  --   --   --   --    < > = values in this interval not displayed.    Estimated Creatinine Clearance: 59 mL/min (by C-G formula based on SCr of 0.78 mg/dL).   Medical History: Past Medical History:  Diagnosis Date   CAD (coronary artery disease) 1999   status post angioplasty w/out stent placement in 1999 done by Dr. Juanda Chance. According to pt, a stent was not placed due to small size vessel. No cardiac events since then.   GERD (gastroesophageal reflux disease)    HLD (hyperlipidemia)    HTN (hypertension)    Mitral valve prolapse    Osteoporosis    TIA (transient ischemic attack)    Assessment: Kristen Maynard is a 77 y.o. year old female presents with chest pain on 08/07/2023 with concern for ACS. Troponin 866 in ED. No anticoagulation prior to admission. Pharmacy consulted to dose heparin.  HL is therapeutic at 0.52 on 1000 units/hr. No signs/symptoms of bleeding noted or issues with infusion. Hgb and PLT  stable. Noted plans for cath on 10/14.   Goal of Therapy:  Heparin level 0.3-0.7 units/ml Monitor platelets by anticoagulation protocol: Yes   Plan:  Continue heparin infusion at 1000 units/hr Daily heparin level, CBC, and monitoring for bleeding F/u plans for anticoagulation after cath  Lennie Muckle, PharmD PGY1 Pharmacy Resident 08/09/2023 7:48 AM

## 2023-08-09 NOTE — Progress Notes (Signed)
Rounding Note    Patient Name: Kristen Maynard Date of Encounter: 08/09/2023  [facility] Cardiologist: None Dr. Odis Hollingshead (new)  Subjective   Denies chest pain at rest. Continues to be on heparin drip. Daughter and granddaughter and great granddaughters at bedside No events overnight.  Inpatient Medications    Scheduled Meds:  aspirin  81 mg Oral Daily   [START ON 08/10/2023] aspirin  81 mg Oral Pre-Cath   atenolol  50 mg Oral Daily   clopidogrel  75 mg Oral Daily   fesoterodine  4 mg Oral Daily   loratadine  10 mg Oral Daily   losartan  100 mg Oral Daily   pantoprazole  40 mg Oral Daily   simvastatin  40 mg Oral q1800   Continuous Infusions:  [START ON 08/10/2023] sodium chloride     Followed by   Melene Muller ON 08/10/2023] sodium chloride     heparin 1,000 Units/hr (08/08/23 1800)   PRN Meds: acetaminophen **OR** acetaminophen, nitroGLYCERIN   Vital Signs    Vitals:   08/09/23 0630 08/09/23 0821 08/09/23 1027 08/09/23 1205  BP: (!) 119/44 (!) 120/93 (!) 141/74 131/68  Pulse: 81 82 80 65  Resp: 18 18  18   Temp: 97.8 F (36.6 C) (!) 96.7 F (35.9 C)  98.3 F (36.8 C)  TempSrc: Oral Oral  Oral  SpO2: 96% 96%  94%  Weight:      Height:        Intake/Output Summary (Last 24 hours) at 08/09/2023 1325 Last data filed at 08/08/2023 1800 Gross per 24 hour  Intake 328.09 ml  Output --  Net 328.09 ml      08/07/2023    8:03 PM 08/29/2015   10:32 AM 07/18/2015   11:39 AM  Last 3 Weights  Weight (lbs) 165 lb 155 lb 6.4 oz 158 lb  Weight (kg) 74.844 kg 70.489 kg 71.668 kg      Telemetry    Normal sinus rhythm without ectopy- Personally Reviewed  ECG    No new EKGs today.- Personally Reviewed  Physical Exam   Physical Exam  Constitutional: No distress.  hemodynamically stable, age-appropriate female.  Neck: No JVD present.  Cardiovascular: Normal rate, regular rhythm, S1 normal, S2 normal, intact distal pulses and normal pulses. Exam  reveals no gallop, no S3 and no S4.  No murmur heard. Pulmonary/Chest: Effort normal and breath sounds normal. No stridor. She has no wheezes. She has no rales.  Abdominal: Soft. Bowel sounds are normal. She exhibits no distension. There is no abdominal tenderness.  Musculoskeletal:        General: No edema.     Cervical back: Neck supple.  Neurological: She is alert and oriented to person, place, and time. She has intact cranial nerves (2-12).  Skin: Skin is warm and moist.   Labs    High Sensitivity Troponin:   Recent Labs  Lab 08/07/23 1958 08/07/23 2012 08/07/23 2322 08/08/23 0046  TROPONINIHS 715* 866* 745* 734*     Chemistry Recent Labs  Lab 08/07/23 2012 08/08/23 0046  NA 141  --   K 4.1  --   CL 104  --   CO2 24  --   GLUCOSE 100*  --   BUN 8  --   CREATININE 0.78  --   CALCIUM 9.2  --   MG  --  2.0  GFRNONAA >60  --   ANIONGAP 13  --     Lipids No results for input(s): "  CHOL", "TRIG", "HDL", "LABVLDL", "LDLCALC", "CHOLHDL" in the last 168 hours.  Hematology Recent Labs  Lab 08/07/23 2012 08/08/23 0046 08/09/23 0451  WBC 7.1 6.8 5.5  RBC 4.36 3.98 4.28  HGB 13.5 12.0 12.7  HCT 38.8 34.6* 37.0  MCV 89.0 86.9 86.4  MCH 31.0 30.2 29.7  MCHC 34.8 34.7 34.3  RDW 13.0 13.1 13.0  PLT 218 178 178   Thyroid No results for input(s): "TSH", "FREET4" in the last 168 hours.  BNPNo results for input(s): "BNP", "PROBNP" in the last 168 hours.  DDimer No results for input(s): "DDIMER" in the last 168 hours.   Radiology    Chest x-ray 08/08/2023: No acute process.  Cardiac Studies   Echocardiogram 08/08/2023: LVEF 60-65%, mild LVH, grade 1 diastolic dysfunction, right ventricular size and function normal, PASP 25.3 mmHg. Aortic valve sclerosis without stenosis. Estimated RAP 3 mmHg.   Patient Profile     77 y.o. female with known history of CAD status post coronary intervention in 1999 with Dr. Westley Gambles, hypertension, dyslipidemia, history of CVA presents  to [facility] for evaluation of chest pain.  Assessment & Plan    Assessment: Non-STEMI Established coronary artery disease with angina pectoris History of CVA Hypertension. Dyslipidemia  Plan: NSTEMI: Established coronary artery disease with angina pectoris on arrival Currently denies chest pain. High sensitive troponins peaked at 866 Echocardiogram: Preserved LVEF see report for additional details.  Personally reviewed the images subtle RWMA in the mid to distal LAD distribution. Telemetry: Normal sinus rhythm without ectopy, personally reviewed No new EKG Tentatively scheduled for left heart catheterization with possible PCI 08/10/2023 Will check fasting lipid profile, direct LDL Continue dual antiplatelet therapy. Continue simvastatin 40 mg p.o. daily.  Benign essential hypertension: Continue home meds. Blood pressures today are within acceptable limits. Will continue to monitor  Hyperlipidemia: Continue statin therapy As discussed above we will check fasting lipids.  Informed Consent   Shared Decision Making/Informed Consent The risks [stroke (1 in 1000), death (1 in 1000), kidney failure [usually temporary] (1 in 500), bleeding (1 in 200), allergic reaction [possibly serious] (1 in 200)], benefits (diagnostic support and management of coronary artery disease) and alternatives of a cardiac catheterization were discussed in detail with Kristen Maynard and she is willing to proceed.  Patient's daughter Kristen Maynard was also present during today's encounter and she verbalizes understanding and agrees to proceed forward.    For questions or updates, please contact [facility] Please consult [email or web address] for contact info under      Signed, Kristen Lerner, DO, [facility]  [facility]  [address] [place], [place] [address] Pager: [phone] Office: [phone] 08/09/2023, 1:25 PM

## 2023-08-10 ENCOUNTER — Encounter (HOSPITAL_COMMUNITY)
Admission: EM | Disposition: A | Discharge status: Home / Self Care | Payer: Self-pay | Source: Home / Self Care | Attending: Family Medicine

## 2023-08-10 DIAGNOSIS — I249 Acute ischemic heart disease, unspecified: Secondary | ICD-10-CM | POA: Diagnosis not present

## 2023-08-10 DIAGNOSIS — E782 Mixed hyperlipidemia: Secondary | ICD-10-CM | POA: Diagnosis not present

## 2023-08-10 DIAGNOSIS — I214 Non-ST elevation (NSTEMI) myocardial infarction: Secondary | ICD-10-CM | POA: Diagnosis not present

## 2023-08-10 DIAGNOSIS — I25110 Atherosclerotic heart disease of native coronary artery with unstable angina pectoris: Secondary | ICD-10-CM | POA: Diagnosis not present

## 2023-08-10 HISTORY — PX: LEFT HEART CATH AND CORONARY ANGIOGRAPHY: CATH118249

## 2023-08-10 LAB — LIPID PANEL
Cholesterol: 126 mg/dL (ref 0–200)
HDL: 37 mg/dL — ABNORMAL LOW (ref >40)
LDL Cholesterol: 65 mg/dL (ref 0–99)
Total CHOL/HDL Ratio: 3.4 {ratio}
Triglycerides: 122 mg/dL (ref <150)
VLDL: 24 mg/dL (ref 0–40)

## 2023-08-10 LAB — COMPREHENSIVE METABOLIC PANEL
ALT: 24 U/L (ref 0–44)
AST: 24 U/L (ref 15–41)
Albumin: 3.2 g/dL — ABNORMAL LOW (ref 3.5–5.0)
Alkaline Phosphatase: 74 U/L (ref 38–126)
Anion gap: 6 (ref 5–15)
BUN: 18 mg/dL (ref 8–23)
CO2: 22 mmol/L (ref 22–32)
Calcium: 8.4 mg/dL — ABNORMAL LOW (ref 8.9–10.3)
Chloride: 110 mmol/L (ref 98–111)
Creatinine, Ser: 0.91 mg/dL (ref 0.44–1.00)
GFR, Estimated: >60 mL/min (ref >60)
Glucose, Bld: 107 mg/dL — ABNORMAL HIGH (ref 70–99)
Potassium: 4 mmol/L (ref 3.5–5.1)
Sodium: 138 mmol/L (ref 135–145)
Total Bilirubin: 0.6 mg/dL (ref 0.3–1.2)
Total Protein: 5.5 g/dL — ABNORMAL LOW (ref 6.5–8.1)

## 2023-08-10 LAB — HEMOGLOBIN A1C
Hgb A1c MFr Bld: 5.3 % (ref 4.8–5.6)
Mean Plasma Glucose: 105.41 mg/dL

## 2023-08-10 LAB — LDL CHOLESTEROL, DIRECT: Direct LDL: 72 mg/dL (ref 0–99)

## 2023-08-10 LAB — CBC
HCT: 35.6 % — ABNORMAL LOW (ref 36.0–46.0)
Hemoglobin: 12.4 g/dL (ref 12.0–15.0)
MCH: 30.5 pg (ref 26.0–34.0)
MCHC: 34.8 g/dL (ref 30.0–36.0)
MCV: 87.5 fL (ref 80.0–100.0)
Platelets: 177 10*3/uL (ref 150–400)
RBC: 4.07 MIL/uL (ref 3.87–5.11)
RDW: 12.8 % (ref 11.5–15.5)
WBC: 4.3 10*3/uL (ref 4.0–10.5)
nRBC: 0 % (ref 0.0–0.2)

## 2023-08-10 LAB — HEPARIN LEVEL (UNFRACTIONATED): Heparin Unfractionated: 0.55 [IU]/mL (ref 0.30–0.70)

## 2023-08-10 SURGERY — LEFT HEART CATH AND CORONARY ANGIOGRAPHY
Anesthesia: LOCAL

## 2023-08-10 MED ORDER — HEPARIN SODIUM (PORCINE) 1000 UNIT/ML IJ SOLN
INTRAMUSCULAR | Status: AC
  Filled 2023-08-10: qty 10

## 2023-08-10 MED ORDER — HEPARIN SODIUM (PORCINE) 1000 UNIT/ML IJ SOLN
INTRAMUSCULAR | Status: DC | PRN
  Administered 2023-08-10: 6000 [IU] via INTRAVENOUS

## 2023-08-10 MED ORDER — LIDOCAINE HCL (PF) 1 % IJ SOLN
INTRAMUSCULAR | Status: AC
  Filled 2023-08-10: qty 30

## 2023-08-10 MED ORDER — ROSUVASTATIN CALCIUM 20 MG PO TABS
40.0000 mg | ORAL_TABLET | Freq: Every day | ORAL | Status: DC
  Administered 2023-08-10: 40 mg via ORAL
  Filled 2023-08-10: qty 2

## 2023-08-10 MED ORDER — ISOSORBIDE MONONITRATE ER 30 MG PO TB24: 30.0000 mg | ORAL_TABLET | Freq: Every day | ORAL | 11 refills | Status: DC

## 2023-08-10 MED ORDER — SODIUM CHLORIDE 0.9% FLUSH: 3.0000 mL | Freq: Two times a day (BID) | INTRAVENOUS | Status: DC

## 2023-08-10 MED ORDER — LIDOCAINE HCL (PF) 1 % IJ SOLN
INTRAMUSCULAR | Status: DC | PRN
  Administered 2023-08-10: 2 mL

## 2023-08-10 MED ORDER — PANTOPRAZOLE SODIUM 40 MG PO TBEC: 40.0000 mg | DELAYED_RELEASE_TABLET | Freq: Every day | ORAL | 3 refills | Status: AC

## 2023-08-10 MED ORDER — MIDAZOLAM HCL 2 MG/2ML IJ SOLN
INTRAMUSCULAR | Status: AC
  Filled 2023-08-10: qty 2

## 2023-08-10 MED ORDER — ROSUVASTATIN CALCIUM 40 MG PO TABS: 40.0000 mg | ORAL_TABLET | Freq: Every day | ORAL | 3 refills | Status: DC

## 2023-08-10 MED ORDER — FENTANYL CITRATE (PF) 100 MCG/2ML IJ SOLN
INTRAMUSCULAR | Status: AC
  Filled 2023-08-10: qty 2

## 2023-08-10 MED ORDER — FENTANYL CITRATE (PF) 100 MCG/2ML IJ SOLN
INTRAMUSCULAR | Status: DC | PRN
  Administered 2023-08-10: 25 ug via INTRAVENOUS

## 2023-08-10 MED ORDER — NITROGLYCERIN 0.4 MG SL SUBL: 0.4000 mg | SUBLINGUAL_TABLET | SUBLINGUAL | 3 refills | Status: AC | PRN

## 2023-08-10 MED ORDER — MIDAZOLAM HCL 2 MG/2ML IJ SOLN
INTRAMUSCULAR | Status: DC | PRN
  Administered 2023-08-10: 1 mg via INTRAVENOUS

## 2023-08-10 MED ORDER — HEPARIN (PORCINE) IN NACL 1000-0.9 UT/500ML-% IV SOLN
INTRAVENOUS | Status: DC | PRN
  Administered 2023-08-10 (×2): 500 mL

## 2023-08-10 MED ORDER — SODIUM CHLORIDE 0.9 % IV SOLN: 250.0000 mL | INTRAVENOUS | Status: DC | PRN

## 2023-08-10 MED ORDER — VERAPAMIL HCL 2.5 MG/ML IV SOLN
INTRAVENOUS | Status: DC | PRN
  Administered 2023-08-10: 10 mL via INTRA_ARTERIAL

## 2023-08-10 MED ORDER — ONDANSETRON HCL 4 MG/2ML IJ SOLN: 4.0000 mg | Freq: Four times a day (QID) | INTRAMUSCULAR | Status: DC | PRN

## 2023-08-10 MED ORDER — SODIUM CHLORIDE 0.9% FLUSH: 3.0000 mL | INTRAVENOUS | Status: DC | PRN

## 2023-08-10 MED ORDER — VERAPAMIL HCL 2.5 MG/ML IV SOLN
INTRAVENOUS | Status: AC
  Filled 2023-08-10: qty 2

## 2023-08-10 MED ORDER — IOHEXOL 350 MG/ML SOLN
INTRAVENOUS | Status: DC | PRN
  Administered 2023-08-10: 22 mL

## 2023-08-10 SURGICAL SUPPLY — 7 items
CATH INFINITI AMBI 5FR TG (CATHETERS) IMPLANT
DEVICE RAD COMP TR BAND LRG (VASCULAR PRODUCTS) IMPLANT
GLIDESHEATH SLEND A-KIT 6F 22G (SHEATH) IMPLANT
GUIDEWIRE INQWIRE 1.5J.035X260 (WIRE) IMPLANT
INQWIRE 1.5J .035X260CM (WIRE) ×1
PACK CARDIAC CATHETERIZATION (CUSTOM PROCEDURE TRAY) ×2 IMPLANT
SET ATX-X65L (MISCELLANEOUS) IMPLANT

## 2023-08-10 NOTE — Interval H&P Note (Signed)
History and Physical Interval Note:  08/10/2023 3:11 PM  Kristen Maynard  has presented today for surgery, with the diagnosis of NSTEMI.  The various methods of treatment have been discussed with the patient and family. After consideration of risks, benefits and other options for treatment, the patient has consented to  Procedure(s): LEFT HEART CATH AND CORONARY ANGIOGRAPHY (N/A) and possible coronary angioplasty as a surgical intervention.  The patient's history has been reviewed, patient examined, no change in status, stable for surgery.  I have reviewed the patient's chart and labs.  Questions were answered to the patient's satisfaction.    Cath Lab Visit (complete for each Cath Lab visit)  Clinical Evaluation Leading to the Procedure:   ACS: Yes.    Non-ACS:    Anginal Classification: CCS IV  Anti-ischemic medical therapy: Minimal Therapy (1 class of medications)  Non-Invasive Test Results: No non-invasive testing performed  Prior CABG: No previous CABG   Yates Decamp

## 2023-08-10 NOTE — Progress Notes (Addendum)
Rounding Note    Patient Name: Kristen Maynard Date of Encounter: 08/10/2023  [facility] Cardiologist: Tessa Lerner, DO   Subjective   No chest pain today, ready for heart cath. 2 daughters at bedside.   Inpatient Medications    Scheduled Meds:  aspirin  81 mg Oral Daily   atenolol  50 mg Oral Daily   clopidogrel  75 mg Oral Daily   fesoterodine  4 mg Oral Daily   loratadine  10 mg Oral Daily   losartan  100 mg Oral Daily   pantoprazole  40 mg Oral Daily   simvastatin  40 mg Oral q1800   Continuous Infusions:  sodium chloride 1 mL/kg/hr (08/10/23 0523)   heparin 1,000 Units/hr (08/09/23 2347)   PRN Meds: acetaminophen **OR** acetaminophen, alum & mag hydroxide-simeth, nitroGLYCERIN   Vital Signs    Vitals:   08/09/23 1802 08/09/23 2013 08/10/23 0700 08/10/23 0919  BP: 124/66 136/81 (!) 139/57 (!) 125/46  Pulse: 69 73 75 71  Resp: 18 18 18    Temp: 98 F (36.7 C) (!) 97.1 F (36.2 C) 97.6 F (36.4 C) 97.8 F (36.6 C)  TempSrc: Oral Oral Oral Oral  SpO2: 96% 96% 97% 99%  Weight:      Height:       No intake or output data in the 24 hours ending 08/10/23 0932    08/07/2023    8:03 PM 08/29/2015   10:32 AM 07/18/2015   11:39 AM  Last 3 Weights  Weight (lbs) 165 lb 155 lb 6.4 oz 158 lb  Weight (kg) 74.844 kg 70.489 kg 71.668 kg      Telemetry    Sinus rhythm with HR 60-70s - Personally Reviewed  ECG    No new tracings - Personally Reviewed  Physical Exam   GEN: No acute distress.   Neck: No JVD Cardiac: RRR, 1/6 systolic murmur Respiratory: Clear to auscultation bilaterally.  No wheezing GI: Soft, nontender, non-distended  Pulses: 2+ MS: No edema; No deformity. Neuro:  Nonfocal  Psych: Normal affect   Labs    High Sensitivity Troponin:   Recent Labs  Lab 08/07/23 1958 08/07/23 2012 08/07/23 2322 08/08/23 0046  TROPONINIHS 715* 866* 745* 734*     Chemistry Recent Labs  Lab 08/07/23 2012 08/08/23 0046 08/09/23 1323   NA 141  --  138  K 4.1  --  4.1  CL 104  --  104  CO2 24  --  25  GLUCOSE 100*  --  145*  BUN 8  --  16  CREATININE 0.78  --  0.97  CALCIUM 9.2  --  9.1  MG  --  2.0  --   GFRNONAA >60  --  59*  ANIONGAP 13  --  9    Lipids No results for input(s): "CHOL", "TRIG", "HDL", "LABVLDL", "LDLCALC", "CHOLHDL" in the last 168 hours.  Hematology Recent Labs  Lab 08/07/23 2012 08/08/23 0046 08/09/23 0451  WBC 7.1 6.8 5.5  RBC 4.36 3.98 4.28  HGB 13.5 12.0 12.7  HCT 38.8 34.6* 37.0  MCV 89.0 86.9 86.4  MCH 31.0 30.2 29.7  MCHC 34.8 34.7 34.3  RDW 13.0 13.1 13.0  PLT 218 178 178   Thyroid No results for input(s): "TSH", "FREET4" in the last 168 hours.  BNPNo results for input(s): "BNP", "PROBNP" in the last 168 hours.  DDimer No results for input(s): "DDIMER" in the last 168 hours.   Radiology    ECHOCARDIOGRAM COMPLETE  Result Date: 08/08/2023    ECHOCARDIOGRAM REPORT   Patient Name:   TRENAE BRUNKE Date of Exam: 08/08/2023 Medical Rec #:  [id number]       Height:       66.0 in Accession #:    [id number]      Weight:       165.0 lb Date of Birth:  1946/06/03       BSA:          1.843 m Patient Age:    77 years        BP:           157/85 mmHg Patient Gender: F               HR:           75 bpm. Exam Location:  Inpatient Procedure: 2D Echo, Color Doppler, Cardiac Doppler and Intracardiac            Opacification Agent Indications:    NSTEMI  History:        Patient has prior history of Echocardiogram examinations, most                 recent 07/03/2015. NSTEMI and CAD, Stroke; Risk                 Factors:Dyslipidemia and Hypertension.  Sonographer:    Milbert Coulter Referring Phys: [id number] VASUNDHRA RATHORE IMPRESSIONS  1. Left ventricular ejection fraction, by estimation, is 60 to 65%. The left ventricle has normal function. The left ventricle has no regional wall motion abnormalities. There is mild left ventricular hypertrophy. Left ventricular diastolic parameters are consistent with  Grade I diastolic dysfunction (impaired relaxation).  2. Right ventricular systolic function is normal. The right ventricular size is normal. There is normal pulmonary artery systolic pressure. The estimated right ventricular systolic pressure is 25.3 mmHg.  3. The mitral valve is normal in structure. No evidence of mitral valve regurgitation.  4. Aortic valve regurgitation is not visualized. Aortic valve sclerosis/calcification is present, without any evidence of aortic stenosis.  5. The inferior vena cava is normal in size with greater than 50% respiratory variability, suggesting right atrial pressure of 3 mmHg. FINDINGS  Left Ventricle: Left ventricular ejection fraction, by estimation, is 60 to 65%. The left ventricle has normal function. The left ventricle has no regional wall motion abnormalities. Definity contrast agent was given IV to delineate the left ventricular  endocardial borders. The left ventricular internal cavity size was normal in size. There is mild left ventricular hypertrophy. Left ventricular diastolic parameters are consistent with Grade I diastolic dysfunction (impaired relaxation). Right Ventricle: The right ventricular size is normal. Right ventricular systolic function is normal. There is normal pulmonary artery systolic pressure. The tricuspid regurgitant velocity is 2.36 m/s, and with an assumed right atrial pressure of 3 mmHg,  the estimated right ventricular systolic pressure is 25.3 mmHg. Left Atrium: Left atrial size was normal in size. Right Atrium: Right atrial size was normal in size. Pericardium: There is no evidence of pericardial effusion. Mitral Valve: The mitral valve is normal in structure. No evidence of mitral valve regurgitation. Tricuspid Valve: Tricuspid valve regurgitation is mild. Aortic Valve: Aortic valve regurgitation is not visualized. Aortic valve sclerosis/calcification is present, without any evidence of aortic stenosis. Aortic valve mean gradient measures 4.0  mmHg. Aortic valve peak gradient measures 7.2 mmHg. Aortic valve  area, by VTI measures 2.79 cm. Pulmonic Valve: Pulmonic valve regurgitation is not visualized. Aorta: The aortic  root is normal in size and structure. Venous: The inferior vena cava is normal in size with greater than 50% respiratory variability, suggesting right atrial pressure of 3 mmHg. IAS/Shunts: No atrial level shunt detected by color flow Doppler.  LEFT VENTRICLE PLAX 2D LVIDd:         4.00 cm   Diastology LVIDs:         2.50 cm   LV e' medial:    4.90 cm/s LV PW:         1.20 cm   LV E/e' medial:  13.4 LV IVS:        1.20 cm   LV e' lateral:   7.18 cm/s LVOT diam:     2.00 cm   LV E/e' lateral: 9.1 LV SV:         86 LV SV Index:   47 LVOT Area:     3.14 cm  RIGHT VENTRICLE RV Basal diam:  2.70 cm RV Mid diam:    2.40 cm RV S prime:     12.20 cm/s TAPSE (M-mode): 1.4 cm LEFT ATRIUM             Index        RIGHT ATRIUM           Index LA diam:        4.00 cm 2.17 cm/m   RA Area:     10.10 cm LA Vol (A2C):   36.9 ml 20.02 ml/m  RA Volume:   19.40 ml  10.53 ml/m LA Vol (A4C):   32.8 ml 17.80 ml/m LA Biplane Vol: 35.4 ml 19.21 ml/m  AORTIC VALVE AV Area (Vmax):    2.98 cm AV Area (Vmean):   2.88 cm AV Area (VTI):     2.79 cm AV Vmax:           134.00 cm/s AV Vmean:          95.500 cm/s AV VTI:            0.310 m AV Peak Grad:      7.2 mmHg AV Mean Grad:      4.0 mmHg LVOT Vmax:         127.00 cm/s LVOT Vmean:        87.500 cm/s LVOT VTI:          0.275 m LVOT/AV VTI ratio: 0.89  AORTA Ao Root diam: 3.10 cm MITRAL VALVE                TRICUSPID VALVE MV Area (PHT): 3.19 cm     TR Peak grad:   22.3 mmHg MV Decel Time: 238 msec     TR Vmax:        236.00 cm/s MV E velocity: 65.50 cm/s MV A velocity: 136.00 cm/s  SHUNTS MV E/A ratio:  0.48         Systemic VTI:  0.28 m                             Systemic Diam: 2.00 cm Carolan Clines Electronically signed by Carolan Clines Signature Date/Time: 08/08/2023/1:52:33 PM    Final     Cardiac Studies    Heart cath today  Echo 08/08/23:  1. Left ventricular ejection fraction, by estimation, is 60 to 65%. The  left ventricle has normal function. The left ventricle has no regional  wall motion abnormalities. There is mild left ventricular hypertrophy.  Left  ventricular diastolic parameters  are consistent with Grade I diastolic dysfunction (impaired relaxation).   2. Right ventricular systolic function is normal. The right ventricular  size is normal. There is normal pulmonary artery systolic pressure. The  estimated right ventricular systolic pressure is 25.3 mmHg.   3. The mitral valve is normal in structure. No evidence of mitral valve  regurgitation.   4. Aortic valve regurgitation is not visualized. Aortic valve  sclerosis/calcification is present, without any evidence of aortic  stenosis.   5. The inferior vena cava is normal in size with greater than 50%  respiratory variability, suggesting right atrial pressure of 3 mmHg.   Patient Profile     77 y.o. female with known history of CAD status post coronary intervention in 1999 with Dr. Westley Gambles, hypertension, dyslipidemia, history of CVA presents to [facility] for evaluation of chest pain.   Assessment & Plan    NSTEMI CAD - hx of CAD with remote cath in 1999 with POBA, no stent - admitted with chest pain concerning for angina with elevated troponin that peaked at 866, now downtrending 734 - echo with preserved EF with "subtle RWMA in the mid to distal LAD distribution" - plan for LHC today - continue heparin  - continue home DAPT with ASA and plavix - continue statin and atenolol - plan for heart cath today as schedule allows   Hypertension - continue losartan 100 mg - BMP stable   Hyperlipidemia with LDL goal < 70 - lipid panel, LP(a), and A1c will be collected this morning - continue statin - but will switch zocor to 40 mg crestor   Hx of CVA - timing unclear - has been maintained on ASA and  plavix      For questions or updates, please contact [facility] Please consult [email or web address] for contact info under        Signed, Marcelino Duster, PA  08/10/2023, 9:32 AM     Patient seen and examined. Agree with assessment and plan.  No recurrent chest pain on IV nitroglycerin.  Echo shows normal LV function with EF 60 to 65% with subtle wall motion abnormality mid-distal LAD distribution.  Troponin elevation consistent with non-STEMI increasing to 866.  He is status post PTCA in 1999 by Dr. Dickie La.  I have reviewed the risks, indications, and alternatives to cardiac catheterization, possible angioplasty, and stenting with the patient, her daughter, and granddaughter who are in the room,  risks include but are not limited to bleeding, infection, vascular injury, stroke, myocardial infection, arrhythmia, kidney injury, radiation-related injury in the case of prolonged fluoroscopy use, emergency cardiac surgery, and death. The patient understands the risks of serious complication is 1-2 in 1000 with diagnostic cardiac cath and 1-2% or less with angioplasty/stenting.  Plan cath with possible PCI later today.  Lennette Bihari, MD, [facility] 08/10/2023 10:30 AM

## 2023-08-10 NOTE — H&P (View-Only) (Signed)
Rounding Note    Patient Name: Kristen Maynard Date of Encounter: 08/10/2023  [facility] Cardiologist: Tessa Lerner, DO   Subjective   No chest pain today, ready for heart cath. 2 daughters at bedside.   Inpatient Medications    Scheduled Meds:  aspirin  81 mg Oral Daily   atenolol  50 mg Oral Daily   clopidogrel  75 mg Oral Daily   fesoterodine  4 mg Oral Daily   loratadine  10 mg Oral Daily   losartan  100 mg Oral Daily   pantoprazole  40 mg Oral Daily   simvastatin  40 mg Oral q1800   Continuous Infusions:  sodium chloride 1 mL/kg/hr (08/10/23 0523)   heparin 1,000 Units/hr (08/09/23 2347)   PRN Meds: acetaminophen **OR** acetaminophen, alum & mag hydroxide-simeth, nitroGLYCERIN   Vital Signs    Vitals:   08/09/23 1802 08/09/23 2013 08/10/23 0700 08/10/23 0919  BP: 124/66 136/81 (!) 139/57 (!) 125/46  Pulse: 69 73 75 71  Resp: 18 18 18    Temp: 98 F (36.7 C) (!) 97.1 F (36.2 C) 97.6 F (36.4 C) 97.8 F (36.6 C)  TempSrc: Oral Oral Oral Oral  SpO2: 96% 96% 97% 99%  Weight:      Height:       No intake or output data in the 24 hours ending 08/10/23 0932    08/07/2023    8:03 PM 08/29/2015   10:32 AM 07/18/2015   11:39 AM  Last 3 Weights  Weight (lbs) 165 lb 155 lb 6.4 oz 158 lb  Weight (kg) 74.844 kg 70.489 kg 71.668 kg      Telemetry    Sinus rhythm with HR 60-70s - Personally Reviewed  ECG    No new tracings - Personally Reviewed  Physical Exam   GEN: No acute distress.   Neck: No JVD Cardiac: RRR, 1/6 systolic murmur Respiratory: Clear to auscultation bilaterally.  No wheezing GI: Soft, nontender, non-distended  Pulses: 2+ MS: No edema; No deformity. Neuro:  Nonfocal  Psych: Normal affect   Labs    High Sensitivity Troponin:   Recent Labs  Lab 08/07/23 1958 08/07/23 2012 08/07/23 2322 08/08/23 0046  TROPONINIHS 715* 866* 745* 734*     Chemistry Recent Labs  Lab 08/07/23 2012 08/08/23 0046 08/09/23 1323   NA 141  --  138  K 4.1  --  4.1  CL 104  --  104  CO2 24  --  25  GLUCOSE 100*  --  145*  BUN 8  --  16  CREATININE 0.78  --  0.97  CALCIUM 9.2  --  9.1  MG  --  2.0  --   GFRNONAA >60  --  59*  ANIONGAP 13  --  9    Lipids No results for input(s): "CHOL", "TRIG", "HDL", "LABVLDL", "LDLCALC", "CHOLHDL" in the last 168 hours.  Hematology Recent Labs  Lab 08/07/23 2012 08/08/23 0046 08/09/23 0451  WBC 7.1 6.8 5.5  RBC 4.36 3.98 4.28  HGB 13.5 12.0 12.7  HCT 38.8 34.6* 37.0  MCV 89.0 86.9 86.4  MCH 31.0 30.2 29.7  MCHC 34.8 34.7 34.3  RDW 13.0 13.1 13.0  PLT 218 178 178   Thyroid No results for input(s): "TSH", "FREET4" in the last 168 hours.  BNPNo results for input(s): "BNP", "PROBNP" in the last 168 hours.  DDimer No results for input(s): "DDIMER" in the last 168 hours.   Radiology    ECHOCARDIOGRAM COMPLETE  Result Date: 08/08/2023    ECHOCARDIOGRAM REPORT   Patient Name:   THERMA LASURE Date of Exam: 08/08/2023 Medical Rec #:  [id number]       Height:       66.0 in Accession #:    [id number]      Weight:       165.0 lb Date of Birth:  [birth year]       BSA:          1.843 m Patient Age:    77 years        BP:           157/85 mmHg Patient Gender: F               HR:           75 bpm. Exam Location:  Inpatient Procedure: 2D Echo, Color Doppler, Cardiac Doppler and Intracardiac            Opacification Agent Indications:    NSTEMI  History:        Patient has prior history of Echocardiogram examinations, most                 recent 07/03/2015. NSTEMI and CAD, Stroke; Risk                 Factors:Dyslipidemia and Hypertension.  Sonographer:    Milbert Coulter Referring Phys: [id number] VASUNDHRA RATHORE IMPRESSIONS  1. Left ventricular ejection fraction, by estimation, is 60 to 65%. The left ventricle has normal function. The left ventricle has no regional wall motion abnormalities. There is mild left ventricular hypertrophy. Left ventricular diastolic parameters are consistent with  Grade I diastolic dysfunction (impaired relaxation).  2. Right ventricular systolic function is normal. The right ventricular size is normal. There is normal pulmonary artery systolic pressure. The estimated right ventricular systolic pressure is 25.3 mmHg.  3. The mitral valve is normal in structure. No evidence of mitral valve regurgitation.  4. Aortic valve regurgitation is not visualized. Aortic valve sclerosis/calcification is present, without any evidence of aortic stenosis.  5. The inferior vena cava is normal in size with greater than 50% respiratory variability, suggesting right atrial pressure of 3 mmHg. FINDINGS  Left Ventricle: Left ventricular ejection fraction, by estimation, is 60 to 65%. The left ventricle has normal function. The left ventricle has no regional wall motion abnormalities. Definity contrast agent was given IV to delineate the left ventricular  endocardial borders. The left ventricular internal cavity size was normal in size. There is mild left ventricular hypertrophy. Left ventricular diastolic parameters are consistent with Grade I diastolic dysfunction (impaired relaxation). Right Ventricle: The right ventricular size is normal. Right ventricular systolic function is normal. There is normal pulmonary artery systolic pressure. The tricuspid regurgitant velocity is 2.36 m/s, and with an assumed right atrial pressure of 3 mmHg,  the estimated right ventricular systolic pressure is 25.3 mmHg. Left Atrium: Left atrial size was normal in size. Right Atrium: Right atrial size was normal in size. Pericardium: There is no evidence of pericardial effusion. Mitral Valve: The mitral valve is normal in structure. No evidence of mitral valve regurgitation. Tricuspid Valve: Tricuspid valve regurgitation is mild. Aortic Valve: Aortic valve regurgitation is not visualized. Aortic valve sclerosis/calcification is present, without any evidence of aortic stenosis. Aortic valve mean gradient measures 4.0  mmHg. Aortic valve peak gradient measures 7.2 mmHg. Aortic valve  area, by VTI measures 2.79 cm. Pulmonic Valve: Pulmonic valve regurgitation is not visualized. Aorta: The aortic  root is normal in size and structure. Venous: The inferior vena cava is normal in size with greater than 50% respiratory variability, suggesting right atrial pressure of 3 mmHg. IAS/Shunts: No atrial level shunt detected by color flow Doppler.  LEFT VENTRICLE PLAX 2D LVIDd:         4.00 cm   Diastology LVIDs:         2.50 cm   LV e' medial:    4.90 cm/s LV PW:         1.20 cm   LV E/e' medial:  13.4 LV IVS:        1.20 cm   LV e' lateral:   7.18 cm/s LVOT diam:     2.00 cm   LV E/e' lateral: 9.1 LV SV:         86 LV SV Index:   47 LVOT Area:     3.14 cm  RIGHT VENTRICLE RV Basal diam:  2.70 cm RV Mid diam:    2.40 cm RV S prime:     12.20 cm/s TAPSE (M-mode): 1.4 cm LEFT ATRIUM             Index        RIGHT ATRIUM           Index LA diam:        4.00 cm 2.17 cm/m   RA Area:     10.10 cm LA Vol (A2C):   36.9 ml 20.02 ml/m  RA Volume:   19.40 ml  10.53 ml/m LA Vol (A4C):   32.8 ml 17.80 ml/m LA Biplane Vol: 35.4 ml 19.21 ml/m  AORTIC VALVE AV Area (Vmax):    2.98 cm AV Area (Vmean):   2.88 cm AV Area (VTI):     2.79 cm AV Vmax:           134.00 cm/s AV Vmean:          95.500 cm/s AV VTI:            0.310 m AV Peak Grad:      7.2 mmHg AV Mean Grad:      4.0 mmHg LVOT Vmax:         127.00 cm/s LVOT Vmean:        87.500 cm/s LVOT VTI:          0.275 m LVOT/AV VTI ratio: 0.89  AORTA Ao Root diam: 3.10 cm MITRAL VALVE                TRICUSPID VALVE MV Area (PHT): 3.19 cm     TR Peak grad:   22.3 mmHg MV Decel Time: 238 msec     TR Vmax:        236.00 cm/s MV E velocity: 65.50 cm/s MV A velocity: 136.00 cm/s  SHUNTS MV E/A ratio:  0.48         Systemic VTI:  0.28 m                             Systemic Diam: 2.00 cm Carolan Clines Electronically signed by Carolan Clines Signature Date/Time: 08/08/2023/1:52:33 PM    Final     Cardiac Studies    Heart cath today  Echo 08/08/23:  1. Left ventricular ejection fraction, by estimation, is 60 to 65%. The  left ventricle has normal function. The left ventricle has no regional  wall motion abnormalities. There is mild left ventricular hypertrophy.  Left  ventricular diastolic parameters  are consistent with Grade I diastolic dysfunction (impaired relaxation).   2. Right ventricular systolic function is normal. The right ventricular  size is normal. There is normal pulmonary artery systolic pressure. The  estimated right ventricular systolic pressure is 25.3 mmHg.   3. The mitral valve is normal in structure. No evidence of mitral valve  regurgitation.   4. Aortic valve regurgitation is not visualized. Aortic valve  sclerosis/calcification is present, without any evidence of aortic  stenosis.   5. The inferior vena cava is normal in size with greater than 50%  respiratory variability, suggesting right atrial pressure of 3 mmHg.   Patient Profile     77 y.o. female with known history of CAD status post coronary intervention in 1999 with Dr. Westley Gambles, hypertension, dyslipidemia, history of CVA presents to [facility] for evaluation of chest pain.   Assessment & Plan    NSTEMI CAD - hx of CAD with remote cath in 1999 with POBA, no stent - admitted with chest pain concerning for angina with elevated troponin that peaked at 866, now downtrending 734 - echo with preserved EF with "subtle RWMA in the mid to distal LAD distribution" - plan for LHC today - continue heparin  - continue home DAPT with ASA and plavix - continue statin and atenolol - plan for heart cath today as schedule allows   Hypertension - continue losartan 100 mg - BMP stable   Hyperlipidemia with LDL goal < 70 - lipid panel, LP(a), and A1c will be collected this morning - continue statin - but will switch zocor to 40 mg crestor   Hx of CVA - timing unclear - has been maintained on ASA and  plavix      For questions or updates, please contact [facility] Please consult [email or web address] for contact info under        Signed, Marcelino Duster, PA  08/10/2023, 9:32 AM     Patient seen and examined. Agree with assessment and plan.  No recurrent chest pain on IV nitroglycerin.  Echo shows normal LV function with EF 60 to 65% with subtle wall motion abnormality mid-distal LAD distribution.  Troponin elevation consistent with non-STEMI increasing to 866.  He is status post PTCA in 1999 by Dr. Dickie La.  I have reviewed the risks, indications, and alternatives to cardiac catheterization, possible angioplasty, and stenting with the patient, her daughter, and granddaughter who are in the room,  risks include but are not limited to bleeding, infection, vascular injury, stroke, myocardial infection, arrhythmia, kidney injury, radiation-related injury in the case of prolonged fluoroscopy use, emergency cardiac surgery, and death. The patient understands the risks of serious complication is 1-2 in 1000 with diagnostic cardiac cath and 1-2% or less with angioplasty/stenting.  Plan cath with possible PCI later today.  Lennette Bihari, MD, [facility] 08/10/2023 10:30 AM

## 2023-08-10 NOTE — Discharge Summary (Signed)
Physician Discharge Summary  Kristen Maynard ZOX:[id number] DOB: 1946-07-30 DOA: 08/07/2023  PCP: Paulina Fusi, MD  Admit date: 08/07/2023 Discharge date: 08/10/2023  Time spent: 40 minutes  Recommendations for Outpatient Follow-up:  NSTEMI high risk was treated medically will need Plavix aspirin lifelong 2-week visit follow-up added Imdur and nitroglycerin this hospital stay Needs labs in a week  Discharge Diagnoses:  MAIN problem for hospitalization   NSTEMI  Please see below for itemized issues addressed in HOpsital- refer to other progress notes for clarity if needed  Discharge Condition: Improved  Diet recommendation: Heart healthy  Filed Weights   08/07/23 2003  Weight: 74.8 kg    History of present illness:  20 female with known CAD + (too small to PCI) 1999 Dr. Dickie La previous treadmill stress test no evidence of ischemia Known underlying palpitations-event monitor 2016 NSR Myoview EF 75% no ischemia scar HTN Urinary incontinence Pseudophakia TIA greater than 25 yr ago on aspirin Plavix Presented from home left-sided chest pain dizzy lightheaded and setting of moving several boxes loss of balance Troponin elevation 700 EKG without acute changes started on heparin gtt. ordered SL nitro ordered   Admitted, cardiology consulted started on heparin Echo EF 60-65% LVH, Grade 1 DD She went to cath which showed chronically occluded RCA with no targets for revascularization, was placed on Imdur post cath and sublingual nitro and we changed her statin around We also discontinued her Prilosec she should be on Protonix and she will follow-up in 2 weeks with cardiology for further discussion  Warning signs were explained clearly to the patient with regards to chest pain and she is aware of them    Discharge Exam: Vitals:   08/10/23 1533 08/10/23 1538  BP: (!) 124/51 (!) 125/55  Pulse: 71 68  Resp: 19 17  Temp:    SpO2: (!) 88% 91%    Subj on day of d/c    Coherent pleasant  General Exam on discharge  Awake alert coherent no distress looks well Chest clear no added sounds Abd soft nt dn no rebound no gaurding  Discharge Instructions   Discharge Instructions     AMB referral to Phase II Cardiac Rehabilitation   Complete by: As directed    Diagnosis: NSTEMI   After initial evaluation and assessments completed: Virtual Based Care may be provided alone or in conjunction with Phase 2 Cardiac Rehab based on patient barriers.: Yes   Intensive Cardiac Rehabilitation (ICR) [facility] location only OR Traditional Cardiac Rehabilitation (TCR) *If criteria for ICR are not met will enroll in TCR [facility] ([facility]Rh) only): Yes   Diet - low sodium heart healthy   Complete by: As directed    Discharge instructions   Complete by: As directed    This hospital stay you were diagnosed with unstable angina and high risk of having occlusions in your heart-your cardiac catheterization this admission showed a longstanding occlusion small RCA which was not amenable to any procedure unfortunately-the good news is that you can continue aspirin Plavix   will not need a large surgery going forward we need to change your statin to a stronger 1 a For chest pain which is cardiac we have started long-acting nitrate hold Imdur which can sometimes cause you to be dizzy so sit for couple minutes after taking it for the first several days and ensure that you do not feel dizzy  Get labs at that visit-follow-up with your primary physician-report large amounts of chest pain and/or similar symptoms and come back  to the emergency room.  Do not take Prilosec anymore it can interfere with one of your blood thinners Plavix instead use pantoprazole which I prescribed for you  We will add nitroglycerin under the tongue as well if you have chest pain or angina   Increase activity slowly   Complete by: As directed       Allergies as of 08/10/2023       Reactions   Aggrenox [aspirin-dipyridamole  Er] Other (See Comments)   Headaches maybe? Told never to take med   Codeine Other (See Comments)   Confusion   Sulfa Antibiotics Other (See Comments)   Seizures         Medication List     STOP taking these medications    omeprazole 40 MG capsule Commonly known as: PRILOSEC Replaced by: pantoprazole 40 MG tablet   simvastatin 40 MG tablet Commonly known as: ZOCOR       TAKE these medications    acetaminophen 325 MG tablet Commonly known as: TYLENOL Take 650 mg by mouth every 6 (six) hours as needed for moderate pain.   aspirin 81 MG chewable tablet Chew 1 tablet (81 mg total) by mouth daily.   atenolol 50 MG tablet Commonly known as: TENORMIN Take 50 mg by mouth daily.   CALCIUM 1200+D3 PO Take 1 capsule by mouth in the morning and at bedtime.   CRANBERRY PO Take 1,000 mg by mouth daily.   ferrous sulfate 325 (65 FE) MG tablet Take 325 mg by mouth daily with breakfast.   isosorbide mononitrate 30 MG 24 hr tablet Commonly known as: IMDUR Take 1 tablet (30 mg total) by mouth daily.   loratadine 10 MG tablet Commonly known as: CLARITIN Take 10 mg by mouth daily.   losartan 100 MG tablet Commonly known as: COZAAR Take 100 mg by mouth daily.   multivitamin with minerals Tabs tablet Take 1 tablet by mouth daily.   nitroGLYCERIN 0.4 MG SL tablet Commonly known as: Nitrostat Place 1 tablet (0.4 mg total) under the tongue every 5 (five) minutes as needed for chest pain.   pantoprazole 40 MG tablet Commonly known as: PROTONIX Take 1 tablet (40 mg total) by mouth daily. Start taking on: August 11, 2023 Replaces: omeprazole 40 MG capsule   Plavix 75 MG tablet Generic drug: clopidogrel Take 75 mg by mouth daily.   rosuvastatin 40 MG tablet Commonly known as: CRESTOR Take 1 tablet (40 mg total) by mouth daily.   solifenacin 5 MG tablet Commonly known as: VESICARE Take 5 mg by mouth daily.       Allergies  Allergen Reactions   Aggrenox  [Aspirin-Dipyridamole Er] Other (See Comments)    Headaches maybe? Told never to take med   Codeine Other (See Comments)    Confusion   Sulfa Antibiotics Other (See Comments)    Seizures       The results of significant diagnostics from this hospitalization (including imaging, microbiology, ancillary and laboratory) are listed below for reference.    Significant Diagnostic Studies: ECHOCARDIOGRAM COMPLETE  Result Date: 08/08/2023    ECHOCARDIOGRAM REPORT   Patient Name:   Kristen Maynard Date of Exam: 08/08/2023 Medical Rec #:  [id number]       Height:       66.0 in Accession #:    [id number]      Weight:       165.0 lb Date of Birth:  December 21, 1945       BSA:  1.843 m Patient Age:    77 years        BP:           157/85 mmHg Patient Gender: F               HR:           75 bpm. Exam Location:  Inpatient Procedure: 2D Echo, Color Doppler, Cardiac Doppler and Intracardiac            Opacification Agent Indications:    NSTEMI  History:        Patient has prior history of Echocardiogram examinations, most                 recent 07/03/2015. NSTEMI and CAD, Stroke; Risk                 Factors:Dyslipidemia and Hypertension.  Sonographer:    Milbert Coulter Referring Phys: [id number] VASUNDHRA RATHORE IMPRESSIONS  1. Left ventricular ejection fraction, by estimation, is 60 to 65%. The left ventricle has normal function. The left ventricle has no regional wall motion abnormalities. There is mild left ventricular hypertrophy. Left ventricular diastolic parameters are consistent with Grade I diastolic dysfunction (impaired relaxation).  2. Right ventricular systolic function is normal. The right ventricular size is normal. There is normal pulmonary artery systolic pressure. The estimated right ventricular systolic pressure is 25.3 mmHg.  3. The mitral valve is normal in structure. No evidence of mitral valve regurgitation.  4. Aortic valve regurgitation is not visualized. Aortic valve sclerosis/calcification is  present, without any evidence of aortic stenosis.  5. The inferior vena cava is normal in size with greater than 50% respiratory variability, suggesting right atrial pressure of 3 mmHg. FINDINGS  Left Ventricle: Left ventricular ejection fraction, by estimation, is 60 to 65%. The left ventricle has normal function. The left ventricle has no regional wall motion abnormalities. Definity contrast agent was given IV to delineate the left ventricular  endocardial borders. The left ventricular internal cavity size was normal in size. There is mild left ventricular hypertrophy. Left ventricular diastolic parameters are consistent with Grade I diastolic dysfunction (impaired relaxation). Right Ventricle: The right ventricular size is normal. Right ventricular systolic function is normal. There is normal pulmonary artery systolic pressure. The tricuspid regurgitant velocity is 2.36 m/s, and with an assumed right atrial pressure of 3 mmHg,  the estimated right ventricular systolic pressure is 25.3 mmHg. Left Atrium: Left atrial size was normal in size. Right Atrium: Right atrial size was normal in size. Pericardium: There is no evidence of pericardial effusion. Mitral Valve: The mitral valve is normal in structure. No evidence of mitral valve regurgitation. Tricuspid Valve: Tricuspid valve regurgitation is mild. Aortic Valve: Aortic valve regurgitation is not visualized. Aortic valve sclerosis/calcification is present, without any evidence of aortic stenosis. Aortic valve mean gradient measures 4.0 mmHg. Aortic valve peak gradient measures 7.2 mmHg. Aortic valve  area, by VTI measures 2.79 cm. Pulmonic Valve: Pulmonic valve regurgitation is not visualized. Aorta: The aortic root is normal in size and structure. Venous: The inferior vena cava is normal in size with greater than 50% respiratory variability, suggesting right atrial pressure of 3 mmHg. IAS/Shunts: No atrial level shunt detected by color flow Doppler.  LEFT  VENTRICLE PLAX 2D LVIDd:         4.00 cm   Diastology LVIDs:         2.50 cm   LV e' medial:    4.90 cm/s LV PW:  1.20 cm   LV E/e' medial:  13.4 LV IVS:        1.20 cm   LV e' lateral:   7.18 cm/s LVOT diam:     2.00 cm   LV E/e' lateral: 9.1 LV SV:         86 LV SV Index:   47 LVOT Area:     3.14 cm  RIGHT VENTRICLE RV Basal diam:  2.70 cm RV Mid diam:    2.40 cm RV S prime:     12.20 cm/s TAPSE (M-mode): 1.4 cm LEFT ATRIUM             Index        RIGHT ATRIUM           Index LA diam:        4.00 cm 2.17 cm/m   RA Area:     10.10 cm LA Vol (A2C):   36.9 ml 20.02 ml/m  RA Volume:   19.40 ml  10.53 ml/m LA Vol (A4C):   32.8 ml 17.80 ml/m LA Biplane Vol: 35.4 ml 19.21 ml/m  AORTIC VALVE AV Area (Vmax):    2.98 cm AV Area (Vmean):   2.88 cm AV Area (VTI):     2.79 cm AV Vmax:           134.00 cm/s AV Vmean:          95.500 cm/s AV VTI:            0.310 m AV Peak Grad:      7.2 mmHg AV Mean Grad:      4.0 mmHg LVOT Vmax:         127.00 cm/s LVOT Vmean:        87.500 cm/s LVOT VTI:          0.275 m LVOT/AV VTI ratio: 0.89  AORTA Ao Root diam: 3.10 cm MITRAL VALVE                TRICUSPID VALVE MV Area (PHT): 3.19 cm     TR Peak grad:   22.3 mmHg MV Decel Time: 238 msec     TR Vmax:        236.00 cm/s MV E velocity: 65.50 cm/s MV A velocity: 136.00 cm/s  SHUNTS MV E/A ratio:  0.48         Systemic VTI:  0.28 m                             Systemic Diam: 2.00 cm Carolan Clines Electronically signed by Carolan Clines Signature Date/Time: 08/08/2023/1:52:33 PM    Final    DG Chest 2 View  Result Date: 08/07/2023 CLINICAL DATA:  Chest pain. EXAM: CHEST - 2 VIEW COMPARISON:  05/14/2018. FINDINGS: The heart size and mediastinal contours are within normal limits. There is atherosclerotic calcification of the aorta. No consolidation, effusion, or pneumothorax. Degenerative changes are present in the thoracic spine. No acute osseous abnormality. IMPRESSION: No active cardiopulmonary disease. Electronically Signed    By: Thornell Sartorius M.D.   On: 08/07/2023 22:34    Microbiology: No results found for this or any previous visit (from the past 240 hour(s)).   Labs: Basic Metabolic Panel: Recent Labs  Lab 08/07/23 2012 08/08/23 0046 08/09/23 1323 08/10/23 0908  NA 141  --  138 138  K 4.1  --  4.1 4.0  CL 104  --  104 110  CO2  24  --  25 22  GLUCOSE 100*  --  145* 107*  BUN 8  --  16 18  CREATININE 0.78  --  0.97 0.91  CALCIUM 9.2  --  9.1 8.4*  MG  --  2.0  --   --    Liver Function Tests: Recent Labs  Lab 08/10/23 0908  AST 24  ALT 24  ALKPHOS 74  BILITOT 0.6  PROT 5.5*  ALBUMIN 3.2*   No results for input(s): "LIPASE", "AMYLASE" in the last 168 hours. No results for input(s): "AMMONIA" in the last 168 hours. CBC: Recent Labs  Lab 08/07/23 2012 08/08/23 0046 08/09/23 0451 08/10/23 0908  WBC 7.1 6.8 5.5 4.3  HGB 13.5 12.0 12.7 12.4  HCT 38.8 34.6* 37.0 35.6*  MCV 89.0 86.9 86.4 87.5  PLT 218 178 178 177   Cardiac Enzymes: No results for input(s): "CKTOTAL", "CKMB", "CKMBINDEX", "TROPONINI" in the last 168 hours. BNP: BNP (last 3 results) No results for input(s): "BNP" in the last 8760 hours.  ProBNP (last 3 results) No results for input(s): "PROBNP" in the last 8760 hours.  CBG: No results for input(s): "GLUCAP" in the last 168 hours.     Signed:  Rhetta Mura MD   [facility] 08/10/2023, 3:49 PM

## 2023-08-10 NOTE — Progress Notes (Addendum)
PHARMACY - ANTICOAGULATION CONSULT NOTE  Pharmacy Consult for IV heparin Indication: chest pain/ACS  Allergies  Allergen Reactions   Aggrenox [Aspirin-Dipyridamole Er] Other (See Comments)    Headaches maybe? Told never to take med   Codeine Other (See Comments)    Confusion   Sulfa Antibiotics Other (See Comments)    Seizures     Patient Measurements: Height: 5\' 6"  (167.6 cm) Weight: 74.8 kg (165 lb) IBW/kg (Calculated) : 59.3 Heparin Dosing Weight: 74.3 kg  Vital Signs: Temp: 97.8 F (36.6 C) (10/14 0919) Temp Source: Oral (10/14 0919) BP: 125/46 (10/14 0919) Pulse Rate: 71 (10/14 0919)  Labs: Recent Labs    08/07/23 2012 08/07/23 2322 08/08/23 0046 08/08/23 0538 08/08/23 2243 08/09/23 0451 08/09/23 1323 08/10/23 0908  HGB 13.5  --  12.0  --   --  12.7  --  12.4  HCT 38.8  --  34.6*  --   --  37.0  --  35.6*  PLT 218  --  178  --   --  178  --  177  HEPARINUNFRC  --   --   --    < > 0.52 0.52  --  0.55  CREATININE 0.78  --   --   --   --   --  0.97 0.91  TROPONINIHS 866* 745* 734*  --   --   --   --   --    < > = values in this interval not displayed.    Estimated Creatinine Clearance: 51.8 mL/min (by C-G formula based on SCr of 0.91 mg/dL).   Medical History: Past Medical History:  Diagnosis Date   CAD (coronary artery disease) 1999   status post angioplasty w/out stent placement in 1999 done by Dr. Juanda Chance. According to pt, a stent was not placed due to small size vessel. No cardiac events since then.   GERD (gastroesophageal reflux disease)    HLD (hyperlipidemia)    HTN (hypertension)    Mitral valve prolapse    Osteoporosis    TIA (transient ischemic attack)    Assessment: Kristen Maynard is a 77 y.o. year old female presents with chest pain on 08/07/2023 with concern for ACS. Troponin 866 in ED. No anticoagulation prior to admission. Pharmacy consulted to dose heparin.  Heparin level is therapeutic at 0.55 on 1000 units/hr.  No signs/symptoms  of bleeding noted or issues with infusion.  CBC stable (Hgb 12.4, pltc 1777).  Noted plans for LHC today.   Goal of Therapy:  Heparin level 0.3-0.7 units/ml Monitor platelets by anticoagulation protocol: Yes   Plan:  Continue heparin infusion at 1000 units/hr Daily heparin level, CBC, and monitoring for bleeding F/u plans for anticoagulation after cath  Trixie Rude, PharmD Clinical Pharmacist 08/10/2023  11:53 AM

## 2023-08-10 NOTE — TOC CM/SW Note (Signed)
Transition of Care [facility]) - Inpatient Brief Assessment   Patient Details  Name: Kristen Maynard MRN: [id number] Date of Birth: 1946-04-28  Transition of Care [facility]) CM/SW Contact:    Gala Lewandowsky, RN Phone Number: 08/10/2023, 1:54 PM   Clinical Narrative: Patient presented for chest pain-Nstemi. Plan for LHC today. Case Manager will continue to follow for transition of care needs as the patient progresses.   Transition of Care Asessment: Insurance and Status: Insurance coverage has been reviewed Patient has primary care physician: Yes Prior/Current Home Services: No current home services Social Determinants of Health Reivew: SDOH reviewed no interventions necessary Readmission risk has been reviewed: Yes Transition of care needs: no transition of care needs at this time

## 2023-08-11 ENCOUNTER — Telehealth (HOSPITAL_COMMUNITY): Payer: Self-pay

## 2023-08-11 ENCOUNTER — Encounter (HOSPITAL_COMMUNITY): Payer: Self-pay | Admitting: Cardiology

## 2023-08-11 NOTE — Telephone Encounter (Signed)
Office referral received. Called pt to see if she interested in Cardiac Rehab Program. Pt denied and is not interested in participating. Did express to pt she has a year from the date of event to use the referral if there is a change of mind. Pt stated she will follow up with her PCP and if they recommend her to cardiac rehab she will call but for now to close her referral.Will close the referral for now!

## 2023-08-12 LAB — LIPOPROTEIN A (LPA): Lipoprotein (a): 159.1 nmol/L — ABNORMAL HIGH (ref <75.0)

## 2023-08-24 DIAGNOSIS — I10 Essential (primary) hypertension: Secondary | ICD-10-CM | POA: Diagnosis not present

## 2023-08-24 DIAGNOSIS — E782 Mixed hyperlipidemia: Secondary | ICD-10-CM | POA: Diagnosis not present

## 2023-08-24 DIAGNOSIS — K219 Gastro-esophageal reflux disease without esophagitis: Secondary | ICD-10-CM | POA: Diagnosis not present

## 2023-08-24 DIAGNOSIS — I2510 Atherosclerotic heart disease of native coronary artery without angina pectoris: Secondary | ICD-10-CM | POA: Diagnosis not present

## 2023-08-24 DIAGNOSIS — Z8673 Personal history of transient ischemic attack (TIA), and cerebral infarction without residual deficits: Secondary | ICD-10-CM | POA: Diagnosis not present

## 2023-08-30 NOTE — Progress Notes (Unsigned)
Cardiology Office Note:    Date:  08/31/2023  ID:  [name], DOB [birth year], MRN [id number] PCP: Paulina Fusi, MD  [facility] Providers Cardiologist:  Marlyn Corporal Madireddy, MD       Patient Profile:      Coronary artery disease  S/p POBA in 1999 Myoview 07/18/15: EF 75%, no ischemia or scar, normal study Echo 07/03/15: Vigorous LVF, EF 65-70%, normal wall motion NSTEMI 07/2023 - med Rx LHC 08/10/23: pRCA 100 R-R and L-R collaterals, LAD min diffuse dz, oD2 95; oLCx 10, pLCx 20  TTE 08/08/23: EF 60-65, no RWMA, mild LVH, Gr 1 DD, NL RVSF, NL PASP, RVSP 25.3, AV sclerosis, RAP 3 Hypertension Hyperlipidemia  S/p CVA         History of Present Illness:   Kristen Maynard is a 77 y.o. female who returns for post hospital follow up. She was admitted 10/11-10/14 with a NSTEMI. TTE showed preserved LVF with EF 60-65. Cardiac catheterization demonstrated an occluded pRCA which was not amenable to PCI. Med Rx was recommended.  She is here today with her son.  Since discharge, she has done well.  She has not had recurrent chest discomfort, shortness of breath, syncope.  She has not had orthopnea, leg edema.  She initially declined cardiac rehab.  She would prefer to go to [facility] if possible.  She would also like to follow-up with one of our cardiologist in [place].     ROS   See HPI     Studies Reviewed:       [facility] labs reviewed 08/24/2023: Total cholesterol 105, HDL 39, LDL 39, triglycerides 161, hemoglobin 12.3, creatinine 0.83, K+ 4.2, ALT 21           Risk Assessment/Calculations:             Physical Exam:   VS:  BP 128/70   Pulse 66   Ht 5\' 7"  (1.702 m)   Wt 162 lb 6.4 oz (73.7 kg)   SpO2 97%   BMI 25.44 kg/m    Wt Readings from Last 3 Encounters:  08/31/23 162 lb 6.4 oz (73.7 kg)  08/07/23 165 lb (74.8 kg)  08/29/15 155 lb 6.4 oz (70.5 kg)    Constitutional:      Appearance: Healthy appearance. Not in distress.  Neck:     Vascular:  JVD normal.  Pulmonary:     Breath sounds: Normal breath sounds. No wheezing. No rales.  Cardiovascular:     Normal rate. Regular rhythm.     Murmurs: There is no murmur.     Comments: R wrist w/o hematoma Edema:    Peripheral edema absent.  Abdominal:     Palpations: Abdomen is soft.        Assessment and Plan:   Assessment & Plan NSTEMI (non-ST elevated myocardial infarction) (HCC) S/p POBA in 1999. She was recently admitted with a NSTEMI. Cath demonstrated an occluded RCA w collaterals. Med Rx was recommended. She is doing well w/o recurrent symptoms of angina. She is not sure if she can go to cardiac rehabilitation. I have encouraged her to increase walking. She will be in touch if she would like to go to cardiac rehabilitation. She would need to go to [facility]. She has been on chronic Clopidogrel Rx. We discussed the importance of DAPT for 1 year after ACS. -Continue ASA 81 mg daily, Plavix 75 mg daily, Imdur 30 mg daily, nitroglycerin as needed, Crestor 40 mg daily -  Follow-up 6 months in [place] Essential hypertension Blood pressure is controlled. -Continue atenolol 50 mg daily, Imdur 30 mg daily, losartan 100 mg daily. Mixed hyperlipidemia LDL optimal.  Triglycerides somewhat elevated. -Reduce sugary, processed foods -Continue Crestor 40 mg daily      Dispo:  Return in about 6 months (around 02/28/2024) for Routine Follow Up in [place].  Signed, [name], PA-C

## 2023-08-31 ENCOUNTER — Encounter: Payer: Self-pay | Admitting: Physician Assistant

## 2023-08-31 ENCOUNTER — Ambulatory Visit: Payer: Medicare Other | Attending: Physician Assistant | Admitting: Physician Assistant

## 2023-08-31 VITALS — BP 128/70 | HR 66 | Ht 67.0 in | Wt 162.4 lb

## 2023-08-31 DIAGNOSIS — I214 Non-ST elevation (NSTEMI) myocardial infarction: Secondary | ICD-10-CM

## 2023-08-31 DIAGNOSIS — E782 Mixed hyperlipidemia: Secondary | ICD-10-CM

## 2023-08-31 DIAGNOSIS — I10 Essential (primary) hypertension: Secondary | ICD-10-CM

## 2023-08-31 MED ORDER — ATENOLOL 50 MG PO TABS: 50.0000 mg | ORAL_TABLET | Freq: Every day | ORAL | 3 refills | Status: DC

## 2023-08-31 MED ORDER — CLOPIDOGREL BISULFATE 75 MG PO TABS: 75.0000 mg | ORAL_TABLET | Freq: Every day | ORAL | 3 refills | Status: DC

## 2023-08-31 MED ORDER — ROSUVASTATIN CALCIUM 40 MG PO TABS: 40.0000 mg | ORAL_TABLET | Freq: Every day | ORAL | 3 refills | Status: DC

## 2023-08-31 MED ORDER — LOSARTAN POTASSIUM 100 MG PO TABS: 100.0000 mg | ORAL_TABLET | Freq: Every day | ORAL | 3 refills | Status: DC

## 2023-08-31 MED ORDER — ISOSORBIDE MONONITRATE ER 30 MG PO TB24: 30.0000 mg | ORAL_TABLET | Freq: Every day | ORAL | 3 refills | Status: DC

## 2023-08-31 NOTE — Assessment & Plan Note (Signed)
S/p POBA in 1999. She was recently admitted with a NSTEMI. Cath demonstrated an occluded RCA w collaterals. Med Rx was recommended. She is doing well w/o recurrent symptoms of angina. She is not sure if she can go to cardiac rehabilitation. I have encouraged her to increase walking. She will be in touch if she would like to go to cardiac rehabilitation. She would need to go to [facility]. She has been on chronic Clopidogrel Rx. We discussed the importance of DAPT for 1 year after ACS. -Continue ASA 81 mg daily, Plavix 75 mg daily, Imdur 30 mg daily, nitroglycerin as needed, Crestor 40 mg daily -Follow-up 6 months in [place]

## 2023-08-31 NOTE — Patient Instructions (Signed)
Medication Instructions:  Your physician recommends that you continue on your current medications as directed. Please refer to the Current Medication list given to you today.  *If you need a refill on your cardiac medications before your next appointment, please call your pharmacy*   Lab Work: None ordered  If you have labs (blood work) drawn today and your tests are completely normal, you will receive your results only by: MyChart Message (if you have MyChart) OR A paper copy in the mail If you have any lab test that is abnormal or we need to change your treatment, we will call you to review the results.   Testing/Procedures:  None ordered   Follow-Up: At [facility], you and your health needs are our priority.  As part of our continuing mission to provide you with exceptional heart care, we have created designated Provider Care Teams.  These Care Teams include your primary Cardiologist (physician) and Advanced Practice Providers (APPs -  Physician Assistants and Nurse Practitioners) who all work together to provide you with the care you need, when you need it.  We recommend signing up for the patient portal called "MyChart".  Sign up information is provided on this After Visit Summary.  MyChart is used to connect with patients for Virtual Visits (Telemedicine).  Patients are able to view lab/test results, encounter notes, upcoming appointments, etc.  Non-urgent messages can be sent to your provider as well.   To learn more about what you can do with MyChart, go to [email or web address].    Your next appointment:   6 month(s)  Provider:   Huntley Dec, MD or Wallis Bamberg, NP Rosalita Levan)    Other Instructions Please let us know if you are interested in Cardiac Rehab

## 2023-08-31 NOTE — Assessment & Plan Note (Signed)
LDL optimal.  Triglycerides somewhat elevated. -Reduce sugary, processed foods -Continue Crestor 40 mg daily

## 2023-08-31 NOTE — Assessment & Plan Note (Signed)
Blood pressure is controlled. -Continue atenolol 50 mg daily, Imdur 30 mg daily, losartan 100 mg daily.

## 2023-09-30 NOTE — Telephone Encounter (Signed)
Spoke with pt over the phone about recent CP she was experiencing. Pt stated that she during both CP episodes had been exerting herself in the yard and cleaning the basement. She stated she took nitroglycerin both times and the pain subsided. She had not had anymore CP episodes. Pt advised to get evaluated at the ED if any CP begins again (also told her if she feels the need to take any nitroglycerin), she becomes SOB, or begins having palpitations. Pt told we would make [name], PA aware of what is going on. Pt verbalized understanding.

## 2023-10-01 MED ORDER — ISOSORBIDE MONONITRATE ER 60 MG PO TB24: 60.0000 mg | ORAL_TABLET | Freq: Every day | ORAL | 3 refills | Status: DC

## 2023-10-01 NOTE — Telephone Encounter (Signed)
Tried to call patient, left message for patient to call back. Will message patient with Kristen Newcomer PA's advisement.

## 2023-10-01 NOTE — Telephone Encounter (Signed)
Patient called back. Went back over [employer]. Patient verbalized understanding, and she will call [place] office for an appointment.

## 2023-10-01 NOTE — Addendum Note (Signed)
Addended by: Virl Axe, Skyylar Kopf L on: 10/01/2023 01:19 PM   Modules accepted: Orders

## 2023-10-01 NOTE — Telephone Encounter (Signed)
Increase Imdur to 60 mg once daily. Please arrange follow up OV. At her visit with me, she asked to follow up in [place]. If nothing is available in [place] in the next couple of weeks and she is willing to come to [facility], I can see her.  If chest pain worsens or comes on at rest, go to the ED. [name], PA-C    10/01/2023 7:35 AM

## 2023-10-02 ENCOUNTER — Ambulatory Visit: Payer: Medicare Other

## 2023-10-02 VITALS — BP 148/74 | HR 65 | Ht 66.0 in | Wt 162.0 lb

## 2023-10-02 DIAGNOSIS — I10 Essential (primary) hypertension: Secondary | ICD-10-CM

## 2023-10-02 DIAGNOSIS — I25118 Atherosclerotic heart disease of native coronary artery with other forms of angina pectoris: Secondary | ICD-10-CM

## 2023-10-02 MED ORDER — ISOSORBIDE MONONITRATE ER 60 MG PO TB24: 90.0000 mg | ORAL_TABLET | Freq: Every day | ORAL | Status: DC

## 2023-10-02 NOTE — Progress Notes (Signed)
Cardiology Consultation:    Date:  10/02/2023   ID:  Kristen Maynard, DOB 1945/12/05, MRN [id number]  PCP:  Kristen Fusi, MD  Cardiologist:  Kristen Corporal Kire Ferg, MD   Referring MD: Kristen Fusi, MD   No chief complaint on file.    ASSESSMENT AND PLAN:   [name] 77 year old woman with history of CAD prior angioplasty in the setting of MI in 1999, recently NSTEMI October 2024 with cath showing CTO of RCA with distal collaterals continued on medical therapy, preserved biventricular function on echocardiogram, hypertension, hyperlipidemia, mild memory and recall deficits here for follow-up.   Problem List Items Addressed This Visit     CAD (coronary artery disease), prior MI and angioplasty in 1999; recent NSTEMI cath with CTO RCA and distal collaterals, on medical therapy October 2024 - Primary    Currently doing well. Continue on dual antiplatelet therapy with aspirin 81 mg once daily and clopidogrel 75 mg once daily. Plan to continue Plavix for 12 months. Continue Crestor 40 mg once daily.  With anginal symptoms appear to have now improved somewhat with current dose of atenolol 50 mg once daily. Also on Imdur 60 mg once daily.  Given her anginal symptoms and suboptimal blood pressure control will titrate up Imdur further to 90 mg once daily.       Relevant Medications   isosorbide mononitrate (IMDUR) 60 MG 24 hr tablet   Essential hypertension    Pressure suboptimal. Target below 140 over 80 mmHg given her elderly age.  Titrate up Imdur dose to 90 mg once daily which will help also with her anginal. Continue atenolol 50 mg once daily.       Relevant Medications   isosorbide mononitrate (IMDUR) 60 MG 24 hr tablet    Return to clinic tentatively in 3 months or as needed.  History of Present Illness:    Kristen Maynard is a 77 y.o. female who is being seen today for follow-up visit. Recent cardiology visit was 08/31/2023 with Kristen Newcomer, PA-C. PCP  is Kristen Fusi, MD.  Very pleasant woman here for the visit accompanied by her daughter Kristen Maynard. She lives with her husband at home.  Their daughter Kristen Maynard has recently moved in about a week ago to help with the mouth.  Has history of CAD s/p MI 1999 underwent angioplasty, NSTEMI recently October 2024, CTO RCA not amenable to PCI on med therapy, hypertension, hyperlipidemia, mild memory and recall deficits.  Has a of coronary artery disease, recent NSTEMI October 2024 cath at [facility] noted an occluded proximal RCA not amenable to PCI and med therapy recommended, echocardiogram with preserved LVEF 60 to 65%.  After her last visit with cardiology on 08/31/2023 she reached out with concern about chest pain episodes on couple occasions while she was exerting physically in the yard blowing leaves.  Symptoms subsided with rest.  She was titrated up on Imdur 60 mg once daily and here for follow-up.  She has been on dual antiplatelet therapy with aspirin and Plavix.  Overall since discharge she has been doing relatively well.  She, per her daughter, forgets to take it easy with activities and tries to do more.  The other week she physically exerted by blowing leaves in her yard using a battery-powered leaf blower.  She subsequently had chest heaviness and discomfort with subsided with 2-3 sublingual nitroglycerin pills.  This Monday she was doing further physical exertional activity in the house and was having chest discomfort  after moving some heavy equipment.  Symptoms subsided after 2 sublingual nitroglycerin pills.  Subsequently she was tired to get up on isosorbide mononitrate dose to 60 mg from 30 mg.  She has not had any further symptoms since.  She denies any symptoms of lightheadedness, dizziness, headache, nausea, vomiting.  No blurry vision.  Good compliance with medications. Her daughter helps manage her medications and salts the mouth.  Lipoprotein a 159, on 08/10/2023 Lipid panel  total cholesterol 126, HDL 37, LDL 65, triglycerides 122 on 08/10/2023.  This was in the setting of NSTEMI while admitted in the hospital and LDL could be spuriously low. Hemoglobin A1c 5.3. CMP with sodium 138, potassium 4, BUN 18, creatinine 0.91, EGFR greater than 60 Normal transaminases and alkaline phosphatase.  From 08/10/2023 noted chronic occlusion of RCA with distal bridge collaterals and contralateral collaterals from circumflex and LAD.  Felt not amenable to PCI and continued on medical therapy.   Echocardiogram August 08, 2023 noted LVEF 60 to 65%, mild LVH, RV function normal, no significant valve dysfunction  Past Medical History:  Diagnosis Date   CAD (coronary artery disease) 1999   status post angioplasty w/out stent placement in 1999 done by Dr. Juanda Maynard. According to pt, a stent was not placed due to small size vessel. No cardiac events since then.   GERD (gastroesophageal reflux disease)    HLD (hyperlipidemia)    HTN (hypertension)    Mitral valve prolapse    Osteoporosis    TIA (transient ischemic attack)     Past Surgical History:  Procedure Laterality Date   CARDIAC CATHETERIZATION     LEFT HEART CATH AND CORONARY ANGIOGRAPHY N/A 08/10/2023   Procedure: LEFT HEART CATH AND CORONARY ANGIOGRAPHY;  Surgeon: Kristen Decamp, MD;  Location: [facility] INVASIVE CV LAB;  Service: Cardiovascular;  Laterality: N/A;    Current Medications: Current Meds  Medication Sig   aspirin 81 MG chewable tablet Chew 1 tablet (81 mg total) by mouth daily.   atenolol (TENORMIN) 50 MG tablet Take 1 tablet (50 mg total) by mouth daily.   Calcium-Magnesium-Vitamin D (CALCIUM 1200+D3 PO) Take 1 capsule by mouth in the morning and at bedtime.   clopidogrel (PLAVIX) 75 MG tablet Take 1 tablet (75 mg total) by mouth daily.   CRANBERRY PO Take 1,000 mg by mouth daily.   ferrous sulfate 325 (65 FE) MG tablet Take 325 mg by mouth daily with breakfast.   loratadine (CLARITIN) 10 MG tablet Take 10 mg by  mouth daily.   losartan (COZAAR) 100 MG tablet Take 1 tablet (100 mg total) by mouth daily.   Multiple Vitamin (MULTIVITAMIN WITH MINERALS) TABS tablet Take 1 tablet by mouth daily.   nitroGLYCERIN (NITROSTAT) 0.4 MG SL tablet Place 1 tablet (0.4 mg total) under the tongue every 5 (five) minutes as needed for chest pain.   pantoprazole (PROTONIX) 40 MG tablet Take 1 tablet (40 mg total) by mouth daily.   rosuvastatin (CRESTOR) 40 MG tablet Take 1 tablet (40 mg total) by mouth daily.   solifenacin (VESICARE) 5 MG tablet Take 5 mg by mouth daily.   [DISCONTINUED] isosorbide mononitrate (IMDUR) 60 MG 24 hr tablet Take 1 tablet (60 mg total) by mouth daily.     Allergies:   Aggrenox [aspirin-dipyridamole er], Codeine, and Sulfa antibiotics   Social History   Socioeconomic History   Marital status: Married    Spouse name: Not on file   Number of children: Not on file   Years of education:  Not on file   Highest education level: Not on file  Occupational History   Not on file  Tobacco Use   Smoking status: Never   Smokeless tobacco: Not on file  Substance and Sexual Activity   Alcohol use: No   Drug use: No   Sexual activity: Not on file  Other Topics Concern   Not on file  Social History Narrative   Retired.    Social Determinants of Health   Financial Resource Strain: Not on file  Food Insecurity: No Food Insecurity (08/10/2023)   Hunger Vital Sign    Worried About Running Out of Food in the Last Year: Never true    Ran Out of Food in the Last Year: Never true  Transportation Needs: No Transportation Needs (08/10/2023)   PRAPARE - Administrator, Civil Service (Medical): No    Lack of Transportation (Non-Medical): No  Physical Activity: Not on file  Stress: Not on file  Social Connections: Not on file     Family History: The patient's family history includes COPD in her father; Lung cancer in her mother. ROS:   Please see the history of present illness.     All 14 point review of systems negative except as described per history of present illness.  EKGs/Labs/Other Studies Reviewed:    The following studies were reviewed today:   EKG:       Recent Labs: 08/08/2023: Magnesium 2.0 08/10/2023: ALT 24; BUN 18; Creatinine, Ser 0.91; Hemoglobin 12.4; Platelets 177; Potassium 4.0; Sodium 138  Recent Lipid Panel    Component Value Date/Time   CHOL 126 08/10/2023 0908   TRIG 122 08/10/2023 0908   HDL 37 (L) 08/10/2023 0908   CHOLHDL 3.4 08/10/2023 0908   VLDL 24 08/10/2023 0908   LDLCALC 65 08/10/2023 0908   LDLDIRECT 72 08/10/2023 0908    Physical Exam:    VS:  BP (!) 148/74 (BP Location: Right Arm, Patient Position: Sitting)   Pulse 65   Ht 5\' 6"  (1.676 m)   Wt 162 lb (73.5 kg)   SpO2 97%   BMI 26.15 kg/m     Wt Readings from Last 3 Encounters:  10/02/23 162 lb (73.5 kg)  08/31/23 162 lb 6.4 oz (73.7 kg)  08/07/23 165 lb (74.8 kg)     GENERAL:  Well nourished, well developed in no acute distress NECK: No JVD; No carotid bruits CARDIAC: RRR, S1 and S2 present, no murmurs, no rubs, no gallops CHEST:  Clear to auscultation without rales, wheezing or rhonchi  Extremities: No pitting pedal edema. Pulses bilaterally symmetric with radial 2+ and dorsalis pedis 2+ NEUROLOGIC:  Alert and oriented x 3  Medication Adjustments/Labs and Tests Ordered: Current medicines are reviewed at length with the patient today.  Concerns regarding medicines are outlined above.  No orders of the defined types were placed in this encounter.  Meds ordered this encounter  Medications   isosorbide mononitrate (IMDUR) 60 MG 24 hr tablet    Sig: Take 1.5 tablets (90 mg total) by mouth daily.    Signed, Phares Zaccone reddy Ami Mally, MD, MPH, [facility]. 10/02/2023 4:51 PM    [facility]

## 2023-10-02 NOTE — Assessment & Plan Note (Signed)
Pressure suboptimal. Target below 140 over 80 mmHg given her elderly age.  Titrate up Imdur dose to 90 mg once daily which will help also with her anginal. Continue atenolol 50 mg once daily.

## 2023-10-02 NOTE — Assessment & Plan Note (Signed)
Currently doing well. Continue on dual antiplatelet therapy with aspirin 81 mg once daily and clopidogrel 75 mg once daily. Plan to continue Plavix for 12 months. Continue Crestor 40 mg once daily.  With anginal symptoms appear to have now improved somewhat with current dose of atenolol 50 mg once daily. Also on Imdur 60 mg once daily.  Given her anginal symptoms and suboptimal blood pressure control will titrate up Imdur further to 90 mg once daily.

## 2023-10-02 NOTE — Patient Instructions (Signed)
Medication Instructions:   INCREASE: Imdur to 90mg  daily   Lab Work: None Ordered If you have labs (blood work) drawn today and your tests are completely normal, you will receive your results only by: MyChart Message (if you have MyChart) OR A paper copy in the mail If you have any lab test that is abnormal or we need to change your treatment, we will call you to review the results.   Testing/Procedures: None Ordered   Follow-Up: At [facility], you and your health needs are our priority.  As part of our continuing mission to provide you with exceptional heart care, we have created designated Provider Care Teams.  These Care Teams include your primary Cardiologist (physician) and Advanced Practice Providers (APPs -  Physician Assistants and Nurse Practitioners) who all work together to provide you with the care you need, when you need it.  We recommend signing up for the patient portal called "MyChart".  Sign up information is provided on this After Visit Summary.  MyChart is used to connect with patients for Virtual Visits (Telemedicine).  Patients are able to view lab/test results, encounter notes, upcoming appointments, etc.  Non-urgent messages can be sent to your provider as well.   To learn more about what you can do with MyChart, go to [email or web address].    Your next appointment:   3 month(s)  The format for your next appointment:   In Person  Provider:   Huntley Dec, MD    Other Instructions NA

## 2023-10-05 NOTE — Telephone Encounter (Signed)
Error

## 2023-12-31 ENCOUNTER — Ambulatory Visit: Payer: Medicare Other

## 2023-12-31 VITALS — BP 130/82 | HR 64 | Ht 67.0 in | Wt 161.8 lb

## 2023-12-31 DIAGNOSIS — I10 Essential (primary) hypertension: Secondary | ICD-10-CM | POA: Diagnosis not present

## 2023-12-31 DIAGNOSIS — I2510 Atherosclerotic heart disease of native coronary artery without angina pectoris: Secondary | ICD-10-CM

## 2023-12-31 NOTE — Patient Instructions (Signed)
 Medication Instructions:  Your physician recommends that you continue on your current medications as directed. Please refer to the Current Medication list given to you today.  *If you need a refill on your cardiac medications before your next appointment, please call your pharmacy*   Lab Work: None Ordered If you have labs (blood work) drawn today and your tests are completely normal, you will receive your results only by: MyChart Message (if you have MyChart) OR A paper copy in the mail If you have any lab test that is abnormal or we need to change your treatment, we will call you to review the results.   Testing/Procedures: None Ordered   Follow-Up: At [facility], you and your health needs are our priority.  As part of our continuing mission to provide you with exceptional heart care, we have created designated Provider Care Teams.  These Care Teams include your primary Cardiologist (physician) and Advanced Practice Providers (APPs -  Physician Assistants and Nurse Practitioners) who all work together to provide you with the care you need, when you need it.  We recommend signing up for the patient portal called "MyChart".  Sign up information is provided on this After Visit Summary.  MyChart is used to connect with patients for Virtual Visits (Telemedicine).  Patients are able to view lab/test results, encounter notes, upcoming appointments, etc.  Non-urgent messages can be sent to your provider as well.   To learn more about what you can do with MyChart, go to [email or web address].    Your next appointment:   6 -9 month(s)  The format for your next appointment:   In Person  Provider:   Huntley Dec, MD   Other Instructions NA

## 2023-12-31 NOTE — Assessment & Plan Note (Addendum)
 Well-controlled today. Continue with current regimen including atenolol and Imdur as above and losartan 100 mg once daily. Target below 140/80 mmHg. Advised to continue monitoring pedal edema and cut down on salt intake.

## 2023-12-31 NOTE — Progress Notes (Signed)
 Cardiology Consultation:    Date:  12/31/2023   ID:  Kristen Maynard, DOB Mar 25, 1946, MRN [id number]  PCP:  Kristen Fusi, MD  Cardiologist:  Kristen Corporal Sayla Golonka, MD   Referring MD: Kristen Fusi, MD   No chief complaint on file.    ASSESSMENT AND PLAN:   Kristen Maynard 78 year old woman with history of CAD prior angioplasty in the setting of MI in 1999, recently NSTEMI October 2024 with cath showing CTO of RCA with distal collaterals continued on medical therapy, preserved biventricular function on echocardiogram, hypertension, hyperlipidemia, mild memory and recall deficits here for follow-up visit.  Problem List Items Addressed This Visit     CAD (coronary artery disease), prior MI and angioplasty in 1999; recent NSTEMI cath with CTO RCA and distal collaterals, on medical therapy October 2024 - Primary   Stable and doing well on current regimen. Continues with dual antiplatelet therapy. Continue aspirin 81 mg significantly. Continue Plavix until October 2025. Continue Crestor 40 mg once daily.  For antianginal effect she is on atenolol 50 mg once daily Imdur 90 mg once daily.       Essential hypertension   Well-controlled today. Continue with current regimen including atenolol and Imdur as above and losartan 100 mg once daily. Target below 140/80 mmHg. Advised to continue monitoring pedal edema and cut down on salt intake.        Return to clinic tentatively in 6 to 9 weeks for follow-up.  History of Present Illness:    Kristen Maynard is a 78 y.o. female who is being seen today for follow-up visit. Last visit with me was 10/02/2023. PCP is Kristen Fusi, MD.   History of CAD prior angioplasty in 1999 recently NSTEMI October 2024 cath showed CTO of RCA with distal collaterals, continued on medical therapy: Preserved biventricular function on echocardiogram, hypertension, hyperlipidemia, memory and recall deficits  Here for the visit today accompanied  by her daughter.  She lives with her husband and daughter's family at home.  Mentions she has not noticed any ongoing symptoms suggestive of chest pain, shortness of breath, palpitations.  Daughter mentions her mother tries to do more and is usually not scared of anything and she is afraid she proceeds with activities without taking appropriate precautions.  However she notes there is no significant change in her functional capacity and she does not observed her to be short of breath or struggling with chest pain.  Denies any blood in urine or stools. Good compliance with all her medications which her daughter helps her with.  Taking her dose of Imdur that was titrated up previously at 90 mg which seemed to have helped.  Echocardiogram October 2024 with normal biventricular function, mild LVH, no significant valve abnormality.  Coronary angiogram October 2024 with chronic total occlusion of RCA with distal reach collaterals and contralateral collaterals from LCx and LAD.  For not amenable to PCI and continued on medical therapy.   Past Medical History:  Diagnosis Date   CAD (coronary artery disease) 1999   status post angioplasty w/out stent placement in 1999 done by Dr. Juanda Maynard. According to pt, a stent was not placed due to small size vessel. No cardiac events since then.   GERD (gastroesophageal reflux disease)    HLD (hyperlipidemia)    HTN (hypertension)    Mitral valve prolapse    Osteoporosis    TIA (transient ischemic attack)     Past Surgical History:  Procedure Laterality Date  CARDIAC CATHETERIZATION     LEFT HEART CATH AND CORONARY ANGIOGRAPHY N/A 08/10/2023   Procedure: LEFT HEART CATH AND CORONARY ANGIOGRAPHY;  Surgeon: Yates Decamp, MD;  Location: [facility];  Service: Cardiovascular;  Laterality: N/A;    Current Medications: Current Meds  Medication Sig   acetaminophen (TYLENOL) 325 MG tablet Take 650 mg by mouth every 6 (six) hours as needed for moderate  pain (pain score 4-6).   aspirin 81 MG chewable tablet Chew 1 tablet (81 mg total) by mouth daily.   atenolol (TENORMIN) 50 MG tablet Take 1 tablet (50 mg total) by mouth daily.   Calcium-Magnesium-Vitamin D (CALCIUM 1200+D3 PO) Take 1 capsule by mouth in the morning and at bedtime.   clopidogrel (PLAVIX) 75 MG tablet Take 1 tablet (75 mg total) by mouth daily.   CRANBERRY PO Take 1,000 mg by mouth daily.   ferrous sulfate 325 (65 FE) MG tablet Take 325 mg by mouth daily with breakfast.   isosorbide mononitrate (IMDUR) 60 MG 24 hr tablet Take 1.5 tablets (90 mg total) by mouth daily.   loratadine (CLARITIN) 10 MG tablet Take 10 mg by mouth daily.   losartan (COZAAR) 100 MG tablet Take 1 tablet (100 mg total) by mouth daily.   Multiple Vitamin (MULTIVITAMIN WITH MINERALS) TABS tablet Take 1 tablet by mouth daily.   nitroGLYCERIN (NITROSTAT) 0.4 MG SL tablet Place 1 tablet (0.4 mg total) under the tongue every 5 (five) minutes as needed for chest pain.   pantoprazole (PROTONIX) 40 MG tablet Take 1 tablet (40 mg total) by mouth daily.   rosuvastatin (CRESTOR) 40 MG tablet Take 1 tablet (40 mg total) by mouth daily.   solifenacin (VESICARE) 5 MG tablet Take 5 mg by mouth daily.     Allergies:   Aggrenox [aspirin-dipyridamole er], Codeine, and Sulfa antibiotics   Social History   Socioeconomic History   Marital status: Married    Spouse name: Not on file   Number of children: Not on file   Years of education: Not on file   Highest education level: Not on file  Occupational History   Not on file  Tobacco Use   Smoking status: Never   Smokeless tobacco: Not on file  Substance and Sexual Activity   Alcohol use: No   Drug use: No   Sexual activity: Not on file  Other Topics Concern   Not on file  Social History Narrative   Retired.    Social Drivers of Corporate investment banker Strain: Not on file  Food Insecurity: No Food Insecurity (08/10/2023)   Hunger Vital Sign    Worried  About Running Out of Food in the Last Year: Never true    Ran Out of Food in the Last Year: Never true  Transportation Needs: No Transportation Needs (08/10/2023)   PRAPARE - Administrator, Civil Service (Medical): No    Lack of Transportation (Non-Medical): No  Physical Activity: Not on file  Stress: Not on file  Social Connections: Not on file     Family History: The patient's family history includes COPD in her father; Lung cancer in her mother. ROS:   Please see the history of present illness.    All 14 point review of systems negative except as described per history of present illness.  EKGs/Labs/Other Studies Reviewed:    The following studies were reviewed today:   EKG:       Recent Labs: 08/08/2023: Magnesium 2.0 08/10/2023: ALT  24; BUN 18; Creatinine, Ser 0.91; Hemoglobin 12.4; Platelets 177; Potassium 4.0; Sodium 138  Recent Lipid Panel    Component Value Date/Time   CHOL 126 08/10/2023 0908   TRIG 122 08/10/2023 0908   HDL 37 (L) 08/10/2023 0908   CHOLHDL 3.4 08/10/2023 0908   VLDL 24 08/10/2023 0908   LDLCALC 65 08/10/2023 0908   LDLDIRECT 72 08/10/2023 0908    Physical Exam:    VS:  BP 130/82   Pulse 64   Ht 5\' 7"  (1.702 m)   Wt 161 lb 12.8 oz (73.4 kg)   SpO2 95%   BMI 25.34 kg/m     Wt Readings from Last 3 Encounters:  12/31/23 161 lb 12.8 oz (73.4 kg)  10/02/23 162 lb (73.5 kg)  08/31/23 162 lb 6.4 oz (73.7 kg)     GENERAL:  Well nourished, well developed in no acute distress NECK: No JVD; No carotid bruits CARDIAC: RRR, S1 and S2 present, no murmurs, no rubs, no gallops CHEST:  Clear to auscultation without rales, wheezing or rhonchi  Extremities: Trace bilateral pitting ankle edema. Pulses bilaterally symmetric with radial 2+ and dorsalis pedis 2+ NEUROLOGIC:  Alert and oriented x 3  Medication Adjustments/Labs and Tests Ordered: Current medicines are reviewed at length with the patient today.  Concerns regarding medicines  are outlined above.  No orders of the defined types were placed in this encounter.  No orders of the defined types were placed in this encounter.   Signed, Cecille Amsterdam, MD, MPH, [facility]. 12/31/2023 11:27 AM    [facility]

## 2023-12-31 NOTE — Assessment & Plan Note (Signed)
 Stable and doing well on current regimen. Continues with dual antiplatelet therapy. Continue aspirin 81 mg significantly. Continue Plavix until October 2025. Continue Crestor 40 mg once daily.  For antianginal effect she is on atenolol 50 mg once daily Imdur 90 mg once daily.

## 2024-02-19 ENCOUNTER — Other Ambulatory Visit: Payer: Self-pay

## 2024-02-19 ENCOUNTER — Telehealth: Payer: Self-pay

## 2024-02-19 MED ORDER — ISOSORBIDE MONONITRATE ER 60 MG PO TB24: 90.0000 mg | ORAL_TABLET | Freq: Every day | ORAL | 1 refills | Status: DC

## 2024-02-19 NOTE — Telephone Encounter (Signed)
 Called and let patient's daughter Shelvy Dickens per DPR  know her medication had been sent in previously today with the new directions for the increase.  She thanked me for the call and had no additional questions.

## 2024-02-19 NOTE — Telephone Encounter (Signed)
*  STAT* If patient is at the pharmacy, call can be transferred to refill team.   1. Which medications need to be refilled? (please list name of each medication and dose if known)   isosorbide  mononitrate (IMDUR ) 60 MG 24 hr tablet    2. Would you like to learn more about the convenience, safety, & potential cost savings by using the [facility]?   3. Are you open to using the [facility] (Type [facility]. ).  4. Which pharmacy/location (including street and city if local pharmacy) is medication to be sent to?  [facility] - [place], [place] - [address]   5. Do they need a 30 day or 90 day supply?   90 day  Daughter Shelvy Dickens) stated patient's medication was increased and patient is almost out of this medication.

## 2024-02-23 DIAGNOSIS — I10 Essential (primary) hypertension: Secondary | ICD-10-CM | POA: Diagnosis not present

## 2024-02-23 DIAGNOSIS — I2510 Atherosclerotic heart disease of native coronary artery without angina pectoris: Secondary | ICD-10-CM | POA: Diagnosis not present

## 2024-02-23 DIAGNOSIS — R7301 Impaired fasting glucose: Secondary | ICD-10-CM | POA: Diagnosis not present

## 2024-02-23 DIAGNOSIS — Z8673 Personal history of transient ischemic attack (TIA), and cerebral infarction without residual deficits: Secondary | ICD-10-CM | POA: Diagnosis not present

## 2024-02-23 DIAGNOSIS — E782 Mixed hyperlipidemia: Secondary | ICD-10-CM | POA: Diagnosis not present

## 2024-07-12 DIAGNOSIS — G459 Transient cerebral ischemic attack, unspecified: Secondary | ICD-10-CM | POA: Insufficient documentation

## 2024-07-12 DIAGNOSIS — K219 Gastro-esophageal reflux disease without esophagitis: Secondary | ICD-10-CM | POA: Insufficient documentation

## 2024-07-12 DIAGNOSIS — I341 Nonrheumatic mitral (valve) prolapse: Secondary | ICD-10-CM | POA: Insufficient documentation

## 2024-07-13 ENCOUNTER — Ambulatory Visit

## 2024-07-13 VITALS — BP 120/70 | HR 68 | Ht 66.0 in | Wt 163.0 lb

## 2024-07-13 DIAGNOSIS — I10 Essential (primary) hypertension: Secondary | ICD-10-CM | POA: Diagnosis not present

## 2024-07-13 DIAGNOSIS — E782 Mixed hyperlipidemia: Secondary | ICD-10-CM | POA: Diagnosis not present

## 2024-07-13 DIAGNOSIS — I2510 Atherosclerotic heart disease of native coronary artery without angina pectoris: Secondary | ICD-10-CM

## 2024-07-13 NOTE — Progress Notes (Signed)
 Cardiology Consultation:    Date:  07/13/2024   ID:  CELSEY ASSELIN, DOB [birth year], MRN [id number]  PCP:  Keren Vicenta BRAVO, MD  Cardiologist:  Alean SAUNDERS Dyrell Tuccillo, MD   Referring MD: Keren Vicenta BRAVO, MD   No chief complaint on file.    Kristen AND PLAN:   Ms. Maynard 78 year old woman with history of  history of CAD prior angioplasty in the setting of MI in 1999, recently NSTEMI October 2024 with cath showing CTO of RCA with distal collaterals continued on medical therapy, preserved biventricular function LVEF 60 to 65%, mild TR on echocardiogram 08/08/2023, hypertension, hyperlipidemia, mild memory and recall deficits.   Here for routine follow-up visit. No active symptoms.  Problem List Items Addressed This Visit     CAD (coronary artery disease), prior MI and angioplasty in 1999; recent NSTEMI cath with CTO RCA and distal collaterals, on medical therapy October 2024 - Primary   Currently doing well. Remains asymptomatic. Continue dual antiplatelet therapy with aspirin  81 mg once daily and Plavix  75 mg once daily. Tolerating well without any side effects. Will empirically continue Plavix  if no side effects.  Continue Crestor  40 mg once daily.  On antianginal regimen with atenolol  50 mg once daily and Imdur  90 mg once daily.        Essential hypertension   Well-controlled. Target below 130/80 mmHg. Continue atenolol  50 mg once daily Continue losartan  100 mg once daily Continue Imdur  90 mg once daily.       Hyperlipidemia   Last lipid panel from 08/10/2023 reviewed. Optimal numbers with total cholesterol 126, HDL 37, LDL 65 and triglycerides 122. Lipoprotein a elevated 159.1. Continue rosuvastatin  40 mg once daily.       Return to clinic tentatively in 1 year.   History of Present Illness:    Kristen Maynard is a 78 y.o. female who is being seen today for follow-up visit. Last visit with me in the office was 12/31/2023. PCP is Keren Vicenta BRAVO,  MD.  Lives at home with her husband and daughter's family.   history of CAD prior angioplasty in the setting of MI in 1999, recently NSTEMI October 2024 with cath showing CTO of RCA with distal collaterals continued on medical therapy, preserved biventricular function LVEF 60 to 65%, mild TR on echocardiogram 08/08/2023, hypertension, hyperlipidemia, mild memory and recall deficits.  Here for the visit today accompanied by her husband. Mentions has been doing well. Typically they go out every morning to eat breakfast.  Later had lunch and supper at home.  Has been maintaining a steady diet and weight has not fluctuated much, about 4 pounds heavier than last documented weight in April.  Denies any chest pain, shortness of breath, abdominal paroxysmal nocturnal dyspnea.  No pedal edema. No palpitation, lightheadedness, dizziness or syncopal episodes.  Good compliance with all medications.  Last lipid panel from 08/10/2023 notes total cholesterol 126, HDL 37, LDL 65, triglycerides 122 and lipoprotein a elevated at 159.1. Hemoglobin A1c 5.3 Past Medical History:  Diagnosis Date   CAD (coronary artery disease) 10/27/1997   status post angioplasty w/out stent placement in 1999 done by Dr. Obie. According to pt, a stent was not placed due to small size vessel. No cardiac events since then.   Combined form of age-related cataract, left eye 01/21/2018   Elevated troponin 07/02/2015   Essential hypertension 07/03/2015   GERD (gastroesophageal reflux disease)    Herpes simplex eyelid dermatitis 12/07/2018   11/2018 OD RLL medial  aspect - seen by Dr. Crayton 1st     Herpes simplex keratitis, dendritic 10/12/2012   History of stroke 07/03/2015   Hyperlipidemia 07/03/2015   Mitral valve disease 08/13/2010   Mitral valve prolapse    NSTEMI (non-ST elevated myocardial infarction) (HCC) 08/07/2023   Palpitations 07/03/2015   Posterior vitreous detachment of both eyes 01/21/2018   Postoperative  examination 05/27/2018   Prolonged QT interval 08/08/2023   Pseudophakia of right eye 05/03/2014   Senile nuclear sclerosis 10/05/2012   TIA (transient ischemic attack)     Past Surgical History:  Procedure Laterality Date   CARDIAC CATHETERIZATION     LEFT HEART CATH AND CORONARY ANGIOGRAPHY N/A 08/10/2023   Procedure: LEFT HEART CATH AND CORONARY ANGIOGRAPHY;  Surgeon: Ladona Heinz, MD;  Location: [facility];  Service: Cardiovascular;  Laterality: N/A;    Current Medications: Current Meds  Medication Sig   acetaminophen  (TYLENOL ) 325 MG tablet Take 650 mg by mouth every 6 (six) hours as needed for moderate pain (pain score 4-6).   aspirin  81 MG chewable tablet Chew 1 tablet (81 mg total) by mouth daily.   atenolol  (TENORMIN ) 50 MG tablet Take 1 tablet (50 mg total) by mouth daily.   Calcium -Magnesium-Vitamin D (CALCIUM  1200+D3 PO) Take 1 capsule by mouth in the morning and at bedtime.   clopidogrel  (PLAVIX ) 75 MG tablet Take 1 tablet (75 mg total) by mouth daily.   CRANBERRY PO Take 1,000 mg by mouth daily.   ferrous sulfate 325 (65 FE) MG tablet Take 325 mg by mouth daily with breakfast.   isosorbide  mononitrate (IMDUR ) 60 MG 24 hr tablet Take 1.5 tablets (90 mg total) by mouth daily.   loratadine  (CLARITIN ) 10 MG tablet Take 10 mg by mouth daily.   losartan  (COZAAR ) 100 MG tablet Take 1 tablet (100 mg total) by mouth daily.   Multiple Vitamin (MULTIVITAMIN WITH MINERALS) TABS tablet Take 1 tablet by mouth daily.   nitroGLYCERIN  (NITROSTAT ) 0.4 MG SL tablet Place 1 tablet (0.4 mg total) under the tongue every 5 (five) minutes as needed for chest pain.   pantoprazole  (PROTONIX ) 40 MG tablet Take 1 tablet (40 mg total) by mouth daily.   rosuvastatin  (CRESTOR ) 40 MG tablet Take 1 tablet (40 mg total) by mouth daily.     Allergies:   Aggrenox [aspirin -dipyridamole er], Codeine, and Sulfa antibiotics   Social History   Socioeconomic History   Marital status: Married     Spouse name: Not on file   Number of children: Not on file   Years of education: Not on file   Highest education level: Not on file  Occupational History   Not on file  Tobacco Use   Smoking status: Never   Smokeless tobacco: Not on file  Substance and Sexual Activity   Alcohol use: No   Drug use: No   Sexual activity: Not on file  Other Topics Concern   Not on file  Social History Narrative   Retired.    Social Drivers of Corporate investment banker Strain: Not on file  Food Insecurity: No Food Insecurity (08/10/2023)   Hunger Vital Sign    Worried About Running Out of Food in the Last Year: Never true    Ran Out of Food in the Last Year: Never true  Transportation Needs: No Transportation Needs (08/10/2023)   PRAPARE - Administrator, Civil Service (Medical): No    Lack of Transportation (Non-Medical): No  Physical Activity: Not  on file  Stress: Not on file  Social Connections: Not on file     Family History: The patient's family history includes COPD in her father; Lung cancer in her mother. ROS:   Please see the history of present illness.    All 14 point review of systems negative except as described per history of present illness.  EKGs/Labs/Other Studies Reviewed:    The following studies were reviewed today:   EKG:       Recent Labs: 08/08/2023: Magnesium 2.0 08/10/2023: ALT 24; BUN 18; Creatinine, Ser 0.91; Hemoglobin 12.4; Platelets 177; Potassium 4.0; Sodium 138  Recent Lipid Panel    Component Value Date/Time   CHOL 126 08/10/2023 0908   TRIG 122 08/10/2023 0908   HDL 37 (L) 08/10/2023 0908   CHOLHDL 3.4 08/10/2023 0908   VLDL 24 08/10/2023 0908   LDLCALC 65 08/10/2023 0908   LDLDIRECT 72 08/10/2023 0908    Physical Exam:    VS:  BP 120/70   Pulse 68   Ht 5' 6 (1.676 m)   Wt 163 lb (73.9 kg)   SpO2 95%   BMI 26.31 kg/m     Wt Readings from Last 3 Encounters:  07/13/24 163 lb (73.9 kg)  12/31/23 161 lb 12.8 oz (73.4 kg)   10/02/23 162 lb (73.5 kg)     GENERAL:  Well nourished, well developed in no acute distress NECK: No JVD; No carotid bruits CARDIAC: RRR, S1 and S2 present, no murmurs, no rubs, no gallops CHEST:  Clear to auscultation without rales, wheezing or rhonchi  Extremities: No pitting pedal edema. Pulses bilaterally symmetric with radial 2+ and dorsalis pedis 2+ NEUROLOGIC:  Alert and oriented x 3  Medication Adjustments/Labs and Tests Ordered: Current medicines are reviewed at length with the patient today.  Concerns regarding medicines are outlined above.  No orders of the defined types were placed in this encounter.  No orders of the defined types were placed in this encounter.   Signed, Kween Bacorn reddy Ilina Xu, MD, MPH, [facility] 07/13/2024 2:23 PM    [facility]

## 2024-07-13 NOTE — Patient Instructions (Signed)

## 2024-07-13 NOTE — Assessment & Plan Note (Signed)
 Last lipid panel from 08/10/2023 reviewed. Optimal numbers with total cholesterol 126, HDL 37, LDL 65 and triglycerides 122. Lipoprotein a elevated 159.1. Continue rosuvastatin  40 mg once daily.

## 2024-07-13 NOTE — Assessment & Plan Note (Signed)
 Well-controlled. Target below 130/80 mmHg. Continue atenolol  50 mg once daily Continue losartan  100 mg once daily Continue Imdur  90 mg once daily.

## 2024-07-13 NOTE — Assessment & Plan Note (Addendum)
 Currently doing well. Remains asymptomatic. Continue dual antiplatelet therapy with aspirin  81 mg once daily and Plavix  75 mg once daily. Tolerating well without any side effects. Will empirically continue Plavix  if no side effects.  Continue Crestor  40 mg once daily.  On antianginal regimen with atenolol  50 mg once daily and Imdur  90 mg once daily.

## 2024-08-22 ENCOUNTER — Other Ambulatory Visit: Payer: Self-pay

## 2024-08-22 MED ORDER — ISOSORBIDE MONONITRATE ER 60 MG PO TB24: 90.0000 mg | ORAL_TABLET | Freq: Every day | ORAL | 2 refills | Status: AC

## 2024-08-23 DIAGNOSIS — E782 Mixed hyperlipidemia: Secondary | ICD-10-CM | POA: Diagnosis not present

## 2024-08-23 DIAGNOSIS — N3001 Acute cystitis with hematuria: Secondary | ICD-10-CM | POA: Diagnosis not present

## 2024-08-23 DIAGNOSIS — Z8673 Personal history of transient ischemic attack (TIA), and cerebral infarction without residual deficits: Secondary | ICD-10-CM | POA: Diagnosis not present

## 2024-08-23 DIAGNOSIS — R300 Dysuria: Secondary | ICD-10-CM | POA: Diagnosis not present

## 2024-08-23 DIAGNOSIS — I2510 Atherosclerotic heart disease of native coronary artery without angina pectoris: Secondary | ICD-10-CM | POA: Diagnosis not present

## 2024-08-23 DIAGNOSIS — I10 Essential (primary) hypertension: Secondary | ICD-10-CM | POA: Diagnosis not present

## 2024-09-11 ENCOUNTER — Other Ambulatory Visit: Payer: Self-pay | Admitting: Physician Assistant

## 2024-09-26 ENCOUNTER — Other Ambulatory Visit: Payer: Self-pay | Admitting: Physician Assistant

## 2024-10-05 ENCOUNTER — Other Ambulatory Visit: Payer: Self-pay | Admitting: Physician Assistant

## 2024-10-09 ENCOUNTER — Other Ambulatory Visit: Payer: Self-pay | Admitting: Physician Assistant
